# Patient Record
Sex: Female | Born: 1985 | Race: White | Hispanic: No | Marital: Married | Smoking: Never smoker
Health system: Southern US, Community
[De-identification: ages and names within clinical notes are randomized; demographics above are authoritative.]

## PROBLEM LIST (undated history)

## (undated) ENCOUNTER — Inpatient Hospital Stay (HOSPITAL_COMMUNITY): Payer: Self-pay

## (undated) DIAGNOSIS — R42 Dizziness and giddiness: Secondary | ICD-10-CM

## (undated) DIAGNOSIS — R519 Headache, unspecified: Secondary | ICD-10-CM

## (undated) DIAGNOSIS — I341 Nonrheumatic mitral (valve) prolapse: Secondary | ICD-10-CM

## (undated) HISTORY — PX: NOSE SURGERY: SHX723

## (undated) HISTORY — PX: WISDOM TOOTH EXTRACTION: SHX21

---

## 2018-01-19 LAB — OB RESULTS CONSOLE GC/CHLAMYDIA
Chlamydia: NEGATIVE
Gonorrhea: NEGATIVE

## 2018-01-19 LAB — OB RESULTS CONSOLE ABO/RH: RH Type: POSITIVE

## 2018-01-19 LAB — OB RESULTS CONSOLE RPR: RPR: NONREACTIVE

## 2018-01-19 LAB — OB RESULTS CONSOLE RUBELLA ANTIBODY, IGM: Rubella: IMMUNE

## 2018-01-19 LAB — OB RESULTS CONSOLE ANTIBODY SCREEN: Antibody Screen: NEGATIVE

## 2018-01-19 LAB — OB RESULTS CONSOLE HIV ANTIBODY (ROUTINE TESTING): HIV: NONREACTIVE

## 2018-01-19 LAB — OB RESULTS CONSOLE HEPATITIS B SURFACE ANTIGEN: Hepatitis B Surface Ag: NEGATIVE

## 2018-02-17 NOTE — L&D Delivery Note (Signed)
Delivery Note At 11:03 PM a viable female was delivered via Vaginal, Spontaneous (Presentation: direct OA).  APGAR: 9, 9; weight  pending.   Placenta status: delivered spontaneously and completely.  Cord: 3 vessel with the following complications: nuchal x1, easily reducible.   Anesthesia:  Epidural, regional lidocaine Episiotomy: None Lacerations: 1st degree;Perineal Suture Repair: 3.0 vicryl rapide Est. Blood Loss (mL):  156ml, QBL pending   Mom to postpartum.  Baby to Couplet care / Skin to Skin.  Marikay Alar 09/03/2018, 11:35 PM

## 2018-03-03 ENCOUNTER — Encounter (HOSPITAL_COMMUNITY): Payer: Self-pay | Admitting: *Deleted

## 2018-03-03 ENCOUNTER — Inpatient Hospital Stay (HOSPITAL_COMMUNITY)
Admission: AD | Admit: 2018-03-03 | Discharge: 2018-03-03 | Disposition: A | Discharge status: Home / Self Care | Payer: BC Managed Care – PPO | Attending: Obstetrics & Gynecology | Admitting: Obstetrics & Gynecology

## 2018-03-03 DIAGNOSIS — R1010 Upper abdominal pain, unspecified: Secondary | ICD-10-CM | POA: Diagnosis present

## 2018-03-03 DIAGNOSIS — Z3A14 14 weeks gestation of pregnancy: Secondary | ICD-10-CM | POA: Diagnosis not present

## 2018-03-03 DIAGNOSIS — K219 Gastro-esophageal reflux disease without esophagitis: Secondary | ICD-10-CM | POA: Diagnosis not present

## 2018-03-03 DIAGNOSIS — O99611 Diseases of the digestive system complicating pregnancy, first trimester: Secondary | ICD-10-CM | POA: Insufficient documentation

## 2018-03-03 LAB — URINALYSIS, ROUTINE W REFLEX MICROSCOPIC
Bilirubin Urine: NEGATIVE
Glucose, UA: NEGATIVE mg/dL
Hgb urine dipstick: NEGATIVE
KETONES UR: NEGATIVE mg/dL
Leukocytes, UA: NEGATIVE
Nitrite: NEGATIVE
Protein, ur: NEGATIVE mg/dL
Specific Gravity, Urine: 1.013 (ref 1.005–1.030)
pH: 6 (ref 5.0–8.0)

## 2018-03-03 LAB — CBC
HCT: 39.3 % (ref 36.0–46.0)
Hemoglobin: 13.4 g/dL (ref 12.0–15.0)
MCH: 29.8 pg (ref 26.0–34.0)
MCHC: 34.1 g/dL (ref 30.0–36.0)
MCV: 87.3 fL (ref 80.0–100.0)
NRBC: 0 % (ref 0.0–0.2)
Platelets: 243 10*3/uL (ref 150–400)
RBC: 4.5 MIL/uL (ref 3.87–5.11)
RDW: 13.9 % (ref 11.5–15.5)
WBC: 9.7 10*3/uL (ref 4.0–10.5)

## 2018-03-03 LAB — COMPREHENSIVE METABOLIC PANEL
ALT: 23 U/L (ref 0–44)
AST: 18 U/L (ref 15–41)
Albumin: 3.6 g/dL (ref 3.5–5.0)
Alkaline Phosphatase: 51 U/L (ref 38–126)
Anion gap: 10 (ref 5–15)
BUN: 7 mg/dL (ref 6–20)
CO2: 20 mmol/L — ABNORMAL LOW (ref 22–32)
Calcium: 8.6 mg/dL — ABNORMAL LOW (ref 8.9–10.3)
Chloride: 101 mmol/L (ref 98–111)
Creatinine, Ser: 0.53 mg/dL (ref 0.44–1.00)
GFR calc Af Amer: >60 mL/min (ref >60)
GFR calc non Af Amer: >60 mL/min (ref >60)
Glucose, Bld: 107 mg/dL — ABNORMAL HIGH (ref 70–99)
POTASSIUM: 3.4 mmol/L — AB (ref 3.5–5.1)
Sodium: 131 mmol/L — ABNORMAL LOW (ref 135–145)
Total Bilirubin: 0.5 mg/dL (ref 0.3–1.2)
Total Protein: 7.5 g/dL (ref 6.5–8.1)

## 2018-03-03 LAB — LIPASE, BLOOD: LIPASE: 25 U/L (ref 11–51)

## 2018-03-03 LAB — AMYLASE: Amylase: 73 U/L (ref 28–100)

## 2018-03-03 MED ORDER — DICYCLOMINE HCL 10 MG PO CAPS
20.0000 mg | ORAL_CAPSULE | Freq: Once | ORAL | Status: AC
  Administered 2018-03-03: 20 mg via ORAL
  Filled 2018-03-03: qty 2

## 2018-03-03 MED ORDER — DICYCLOMINE HCL 20 MG PO TABS
20.0000 mg | ORAL_TABLET | Freq: Once | ORAL | Status: DC
  Filled 2018-03-03: qty 1

## 2018-03-03 MED ORDER — LIDOCAINE VISCOUS HCL 2 % MT SOLN
15.0000 mL | Freq: Once | OROMUCOSAL | Status: AC
  Administered 2018-03-03: 15 mL via ORAL
  Filled 2018-03-03: qty 15

## 2018-03-03 MED ORDER — FAMOTIDINE 20 MG PO TABS: 20.0000 mg | ORAL_TABLET | Freq: Every day | ORAL | 0 refills | Status: DC

## 2018-03-03 MED ORDER — FAMOTIDINE 20 MG PO TABS
20.0000 mg | ORAL_TABLET | Freq: Once | ORAL | Status: AC
  Administered 2018-03-03: 20 mg via ORAL
  Filled 2018-03-03: qty 1

## 2018-03-03 MED ORDER — ALUM & MAG HYDROXIDE-SIMETH 200-200-20 MG/5ML PO SUSP
30.0000 mL | Freq: Once | ORAL | Status: AC
  Administered 2018-03-03: 30 mL via ORAL
  Filled 2018-03-03: qty 30

## 2018-03-03 NOTE — MAU Note (Signed)
Urine sent to lab 

## 2018-03-03 NOTE — Discharge Instructions (Signed)
Gastroesophageal Reflux Disease, Adult  Gastroesophageal reflux (GER) happens when acid from the stomach flows up into the tube that connects the mouth and the stomach (esophagus). Normally, food travels down the esophagus and stays in the stomach to be digested. With GER, food and stomach acid sometimes move back up into the esophagus. You may have a disease called gastroesophageal reflux disease (GERD) if the reflux:  · Happens often.  · Causes frequent or very bad symptoms.  · Causes problems such as damage to the esophagus.  When this happens, the esophagus becomes sore and swollen (inflamed). Over time, GERD can make small holes (ulcers) in the lining of the esophagus.  What are the causes?  This condition is caused by a problem with the muscle between the esophagus and the stomach. When this muscle is weak or not normal, it does not close properly to keep food and acid from coming back up from the stomach. The muscle can be weak because of:  · Tobacco use.  · Pregnancy.  · Having a certain type of hernia (hiatal hernia).  · Alcohol use.  · Certain foods and drinks, such as coffee, chocolate, onions, and peppermint.  What increases the risk?  You are more likely to develop this condition if you:  · Are overweight.  · Have a disease that affects your connective tissue.  · Use NSAID medicines.  What are the signs or symptoms?  Symptoms of this condition include:  · Heartburn.  · Difficult or painful swallowing.  · The feeling of having a lump in the throat.  · A bitter taste in the mouth.  · Bad breath.  · Having a lot of saliva.  · Having an upset or bloated stomach.  · Belching.  · Chest pain. Different conditions can cause chest pain. Make sure you see your doctor if you have chest pain.  · Shortness of breath or noisy breathing (wheezing).  · Ongoing (chronic) cough or a cough at night.  · Wearing away of the surface of teeth (tooth enamel).  · Weight loss.  How is this treated?  Treatment will depend on how  bad your symptoms are. Your doctor may suggest:  · Changes to your diet.  · Medicine.  · Surgery.  Follow these instructions at home:  Eating and drinking    · Follow a diet as told by your doctor. You may need to avoid foods and drinks such as:  ? Coffee and tea (with or without caffeine).  ? Drinks that contain alcohol.  ? Energy drinks and sports drinks.  ? Bubbly (carbonated) drinks or sodas.  ? Chocolate and cocoa.  ? Peppermint and mint flavorings.  ? Garlic and onions.  ? Horseradish.  ? Spicy and acidic foods. These include peppers, chili powder, curry powder, vinegar, hot sauces, and BBQ sauce.  ? Citrus fruit juices and citrus fruits, such as oranges, lemons, and limes.  ? Tomato-based foods. These include red sauce, chili, salsa, and pizza with red sauce.  ? Fried and fatty foods. These include donuts, french fries, potato chips, and high-fat dressings.  ? High-fat meats. These include hot dogs, rib eye steak, sausage, ham, and bacon.  ? High-fat dairy items, such as whole milk, butter, and cream cheese.  · Eat small meals often. Avoid eating large meals.  · Avoid drinking large amounts of liquid with your meals.  · Avoid eating meals during the 2-3 hours before bedtime.  · Avoid lying down right after you eat.  ·   Do not exercise right after you eat.  Lifestyle    · Do not use any products that contain nicotine or tobacco. These include cigarettes, e-cigarettes, and chewing tobacco. If you need help quitting, ask your doctor.  · Try to lower your stress. If you need help doing this, ask your doctor.  · If you are overweight, lose an amount of weight that is healthy for you. Ask your doctor about a safe weight loss goal.  General instructions  · Pay attention to any changes in your symptoms.  · Take over-the-counter and prescription medicines only as told by your doctor. Do not take aspirin, ibuprofen, or other NSAIDs unless your doctor says it is okay.  · Wear loose clothes. Do not wear anything tight  around your waist.  · Raise (elevate) the head of your bed about 6 inches (15 cm).  · Avoid bending over if this makes your symptoms worse.  · Keep all follow-up visits as told by your doctor. This is important.  Contact a doctor if:  · You have new symptoms.  · You lose weight and you do not know why.  · You have trouble swallowing or it hurts to swallow.  · You have wheezing or a cough that keeps happening.  · Your symptoms do not get better with treatment.  · You have a hoarse voice.  Get help right away if:  · You have pain in your arms, neck, jaw, teeth, or back.  · You feel sweaty, dizzy, or light-headed.  · You have chest pain or shortness of breath.  · You throw up (vomit) and your throw-up looks like blood or coffee grounds.  · You pass out (faint).  · Your poop (stool) is bloody or black.  · You cannot swallow, drink, or eat.  Summary  · If a person has gastroesophageal reflux disease (GERD), food and stomach acid move back up into the esophagus and cause symptoms or problems such as damage to the esophagus.  · Treatment will depend on how bad your symptoms are.  · Follow a diet as told by your doctor.  · Take all medicines only as told by your doctor.  This information is not intended to replace advice given to you by your health care provider. Make sure you discuss any questions you have with your health care provider.  Document Released: 07/23/2007 Document Revised: 08/12/2017 Document Reviewed: 08/12/2017  Elsevier Interactive Patient Education © 2019 [employer]  Food Choices for Gastroesophageal Reflux Disease, Adult  When you have gastroesophageal reflux disease (GERD), the foods you eat and your eating habits are very important. Choosing the right foods can help ease your discomfort. Think about working with a nutrition specialist (dietitian) to help you make good choices.  What are tips for following this plan?    Meals  · Choose healthy foods that are low in fat, such as fruits, vegetables,  whole grains, low-fat dairy products, and lean meat, fish, and poultry.  · Eat small meals often instead of 3 large meals a day. Eat your meals slowly, and in a place where you are relaxed. Avoid bending over or lying down until 2-3 hours after eating.  · Avoid eating meals 2-3 hours before bed.  · Avoid drinking a lot of liquid with meals.  · Cook foods using methods other than frying. Bake, grill, or broil food instead.  · Avoid or limit:  ? Chocolate.  ? Peppermint or spearmint.  ? Alcohol.  ? Pepper.  ?   Black and decaffeinated coffee.  ? Black and decaffeinated tea.  ? Bubbly (carbonated) soft drinks.  ? Caffeinated energy drinks and soft drinks.  · Limit high-fat foods such as:  ? Fatty meat or fried foods.  ? Whole milk, cream, butter, or ice cream.  ? Nuts and nut butters.  ? Pastries, donuts, and sweets made with butter or shortening.  · Avoid foods that cause symptoms. These foods may be different for everyone. Common foods that cause symptoms include:  ? Tomatoes.  ? Oranges, lemons, and limes.  ? Peppers.  ? Spicy food.  ? Onions and garlic.  ? Vinegar.  Lifestyle  · Maintain a healthy weight. Ask your doctor what weight is healthy for you. If you need to lose weight, work with your doctor to do so safely.  · Exercise for at least 30 minutes for 5 or more days each week, or as told by your doctor.  · Wear loose-fitting clothes.  · Do not smoke. If you need help quitting, ask your doctor.  · Sleep with the head of your bed higher than your feet. Use a wedge under the mattress or blocks under the bed frame to raise the head of the bed.  Summary  · When you have gastroesophageal reflux disease (GERD), food and lifestyle choices are very important in easing your symptoms.  · Eat small meals often instead of 3 large meals a day. Eat your meals slowly, and in a place where you are relaxed.  · Limit high-fat foods such as fatty meat or fried foods.  · Avoid bending over or lying down until 2-3 hours after  eating.  · Avoid peppermint and spearmint, caffeine, alcohol, and chocolate.  This information is not intended to replace advice given to you by your health care provider. Make sure you discuss any questions you have with your health care provider.  Document Released: 08/05/2011 Document Revised: 03/11/2016 Document Reviewed: 03/11/2016  Elsevier Interactive Patient Education © 2019 Elsevier Inc[employer]

## 2018-03-03 NOTE — MAU Provider Note (Signed)
Chief Complaint:  Abdominal Pain   First Provider Initiated Contact with Patient 03/03/18 0519     HPI: Brenda Gibbs is a 33 y.o. G1P0 at 314w5dwho presents to maternity admissions reporting Upper abdominal pain, mostly in Left upper quadrant .It started at 4am and was so strong it made it hard to breathe.  Radiates to her back. Has some nausea with it, new also.  Has never really had it before. She denies LOF, vaginal bleeding, vaginal itching/burning, urinary symptoms, h/a, dizziness, diarrhea, constipation or fever/chills.   [facility] but did not call them because the pain was in the upper abdomen and not in the lower abdomen.  .   Abdominal Pain  This is a new problem. The current episode started today. The onset quality is sudden. The problem occurs constantly. The problem has been unchanged. The pain is located in the LUQ and epigastric region. The quality of the pain is sharp. The abdominal pain radiates to the back. Associated symptoms include nausea. Pertinent negatives include no constipation, diarrhea, dysuria, fever, headaches, myalgias or vomiting. The pain is aggravated by deep breathing and palpation. The pain is relieved by nothing. She has tried nothing for the symptoms.   RN Note: PT SAYS WOKE  0400-  FELT SHARP PAIN FROM HER UPPER ABD - RADIATES  TO HER BACK- MAKES IT HARD TO BREATHE.  AND NAUSEA.    Past Medical History: History reviewed. No pertinent past medical history.  Past obstetric history: OB History  Gravida Para Term Preterm AB Living  1            SAB TAB Ectopic Multiple Live Births               # Outcome Date GA Lbr Len/2nd Weight Sex Delivery Anes PTL Lv  1 Current             Past Surgical History: History reviewed. No pertinent surgical history.  Family History: History reviewed. No pertinent family history.  Social History: Social History   Tobacco Use  . Smoking status: Never Smoker  . Smokeless tobacco: Never  Used  Substance Use Topics  . Alcohol use: Not on file  . Drug use: Not on file    Allergies: No Known Allergies  Meds:  Medications Prior to Admission  Medication Sig Dispense Refill Last Dose  . Prenatal Vit-Fe Fumarate-FA (PRENATAL MULTIVITAMIN) TABS tablet Take 1 tablet by mouth daily at 12 noon.   03/02/2018 at Unknown time    I have reviewed patient's Past Medical Hx, Surgical Hx, Family Hx, Social Hx, medications and allergies.   ROS:  Review of Systems  Constitutional: Negative for fever.  Gastrointestinal: Positive for abdominal pain and nausea. Negative for constipation, diarrhea and vomiting.  Genitourinary: Negative for dysuria.  Musculoskeletal: Negative for myalgias.  Neurological: Negative for headaches.   Other systems negative  Physical Exam   Patient Vitals for the past 24 hrs:  BP Temp Temp src Pulse SpO2 Height Weight  03/03/18 0505 123/70 98.4 F (36.9 C) Oral 92 100 % 5\' 5"  (1.651 m) 82.8 kg   Constitutional: Well-developed, well-nourished female in no acute distress.  Cardiovascular: normal rate and rhythm Respiratory: normal effort, clear to auscultation bilaterally GI: Abd soft, non-tender except in her LUQ., abdomen is gravid and appropriate for gestational age.   No rebound or guarding. MS: Extremities nontender, no edema, normal ROM Neurologic: Alert and oriented x 4.  GU: Neg CVAT.  PELVIC EXAM:  deferred  FHT: 152    Labs: Results for orders placed or performed during the hospital encounter of 03/03/18 (from the past 24 hour(s))  CBC     Status: None   Collection Time: 03/03/18  5:33 AM  Result Value Ref Range   WBC 9.7 4.0 - 10.5 K/uL   RBC 4.50 3.87 - 5.11 MIL/uL   Hemoglobin 13.4 12.0 - 15.0 g/dL   HCT 10.1 75.1 - 02.5 %   MCV 87.3 80.0 - 100.0 fL   MCH 29.8 26.0 - 34.0 pg   MCHC 34.1 30.0 - 36.0 g/dL   RDW 85.2 77.8 - 24.2 %   Platelets 243 150 - 400 K/uL   nRBC 0.0 0.0 - 0.2 %  Comprehensive metabolic panel     Status:  Abnormal   Collection Time: 03/03/18  5:33 AM  Result Value Ref Range   Sodium 131 (L) 135 - 145 mmol/L   Potassium 3.4 (L) 3.5 - 5.1 mmol/L   Chloride 101 98 - 111 mmol/L   CO2 20 (L) 22 - 32 mmol/L   Glucose, Bld 107 (H) 70 - 99 mg/dL   BUN 7 6 - 20 mg/dL   Creatinine, Ser 3.53 0.44 - 1.00 mg/dL   Calcium 8.6 (L) 8.9 - 10.3 mg/dL   Total Protein 7.5 6.5 - 8.1 g/dL   Albumin 3.6 3.5 - 5.0 g/dL   AST 18 15 - 41 U/L   ALT 23 0 - 44 U/L   Alkaline Phosphatase 51 38 - 126 U/L   Total Bilirubin 0.5 0.3 - 1.2 mg/dL   GFR calc non Af Amer >60 >60 mL/min   GFR calc Af Amer >60 >60 mL/min   Anion gap 10 5 - 15  Lipase, blood     Status: None   Collection Time: 03/03/18  5:33 AM  Result Value Ref Range   Lipase 25 11 - 51 U/L  Amylase     Status: None   Collection Time: 03/03/18  5:33 AM  Result Value Ref Range   Amylase 73 28 - 100 U/L    Imaging:  No results found.  MAU Course/MDM: I have ordered labs and reviewed results. These were normal   We gave her a GI cocktail with Maalox and Lidocaine, but she did not really get the lidocaine down, as she gagged and expectorated much of it.  States she felt a little better afterward but not much.   Bentyl and Pepcid given with good relief    Assessment: Pregnancy at [redacted]w[redacted]d [redacted] weeks gestation of pregnancy - Plan: Discharge patient  Gastroesophageal reflux disease without esophagitis - Plan: Discharge patient    Plan: Discharge home Rx Pepcid given  Follow up in Office for prenatal visits and recheck of status  Encouraged to return here or to other Urgent Care/ED if she develops worsening of symptoms, increase in pain, fever, or other concerning symptoms.   Pt stable at time of discharge.  Wynelle Bourgeois CNM, MSN Certified Nurse-Midwife 03/03/2018 5:19 AM

## 2018-03-16 ENCOUNTER — Other Ambulatory Visit: Payer: Self-pay | Admitting: Obstetrics and Gynecology

## 2018-03-16 DIAGNOSIS — R109 Unspecified abdominal pain: Secondary | ICD-10-CM

## 2018-03-17 ENCOUNTER — Other Ambulatory Visit: Payer: Self-pay | Admitting: Obstetrics and Gynecology

## 2018-03-19 ENCOUNTER — Ambulatory Visit
Admission: RE | Admit: 2018-03-19 | Discharge: 2018-03-19 | Disposition: A | Discharge status: Home / Self Care | Payer: BC Managed Care – PPO | Source: Ambulatory Visit | Attending: Obstetrics and Gynecology | Admitting: Obstetrics and Gynecology

## 2018-03-19 DIAGNOSIS — R109 Unspecified abdominal pain: Secondary | ICD-10-CM

## 2018-03-28 ENCOUNTER — Other Ambulatory Visit (HOSPITAL_COMMUNITY): Payer: Self-pay | Admitting: Certified Nurse Midwife

## 2018-07-27 LAB — OB RESULTS CONSOLE GBS: GBS: NEGATIVE

## 2018-08-02 ENCOUNTER — Encounter (HOSPITAL_COMMUNITY): Payer: Self-pay | Admitting: *Deleted

## 2018-08-02 ENCOUNTER — Other Ambulatory Visit: Payer: Self-pay

## 2018-08-02 ENCOUNTER — Inpatient Hospital Stay (HOSPITAL_COMMUNITY)
Admission: AD | Admit: 2018-08-02 | Discharge: 2018-08-02 | Disposition: A | Discharge status: Home / Self Care | Payer: BC Managed Care – PPO | Source: Ambulatory Visit | Attending: Obstetrics & Gynecology | Admitting: Obstetrics & Gynecology

## 2018-08-02 DIAGNOSIS — O4703 False labor before 37 completed weeks of gestation, third trimester: Secondary | ICD-10-CM | POA: Insufficient documentation

## 2018-08-02 DIAGNOSIS — Z3A36 36 weeks gestation of pregnancy: Secondary | ICD-10-CM | POA: Diagnosis not present

## 2018-08-02 DIAGNOSIS — O479 False labor, unspecified: Secondary | ICD-10-CM | POA: Diagnosis not present

## 2018-08-02 LAB — URINALYSIS, ROUTINE W REFLEX MICROSCOPIC
Bilirubin Urine: NEGATIVE
Glucose, UA: NEGATIVE mg/dL
Ketones, ur: 5 mg/dL — AB
Leukocytes,Ua: NEGATIVE
Nitrite: NEGATIVE
Protein, ur: NEGATIVE mg/dL
Specific Gravity, Urine: 1.004 — ABNORMAL LOW (ref 1.005–1.030)
pH: 6 (ref 5.0–8.0)

## 2018-08-02 LAB — GC/CHLAMYDIA PROBE AMP (~~LOC~~) NOT AT ARMC
Chlamydia: NEGATIVE
Neisseria Gonorrhea: NEGATIVE

## 2018-08-02 LAB — WET PREP, GENITAL
Clue Cells Wet Prep HPF POC: NONE SEEN
Sperm: NONE SEEN
Trich, Wet Prep: NONE SEEN
Yeast Wet Prep HPF POC: NONE SEEN

## 2018-08-02 NOTE — MAU Note (Signed)
Pt c/o red/brown spotting 4-5 hours ago,  Three hours ago started with mild,  lower  abd cramping. Feels the baby moving well.

## 2018-08-02 NOTE — MAU Provider Note (Signed)
None      S: Ms. Brenda Gibbs is a 33 y.o. G1P0 at [redacted]w[redacted]d  who presents to MAU today complaining contractions q 4 minutes since 10 pm. She denies vaginal bleeding but endorses some reddish-brown spotting. . She denies LOF. She reports normal fetal movement.    O: BP 130/84 (BP Location: Left Arm)   Pulse (!) 121   Temp 98.6 F (37 C) (Oral)   Resp 18   Ht 5\' 5"  (1.651 m)   Wt 86.6 kg   LMP 11/20/2017   BMI 31.78 kg/m  GENERAL: Well-developed, well-nourished female in no acute distress.  HEAD: Normocephalic, atraumatic.  CHEST: Normal effort of breathing, regular heart rate ABDOMEN: Soft, nontender, gravid  Cervical exam:  Dilation: Fingertip Effacement (%): Thick Exam by:: Denyse Dago, RN Patient's exam is unchanged while in MAU; no blood on RN's glove during exam and no bleeding while in MAU.   Fetal Monitoring: Baseline: 140 Variability: mod Accelerations: present Decelerations: negative Contractions: uterine irritability  Will draw wet prep and GC/CT to check for infection, which could possibly be causing discharge.   A: SIUP at [redacted]w[redacted]d  False labor Wet prep negative P: Keep next OB appt; return to MAU if her condition were to worsen or change.  -reviewed warning signs and when to return to MAU  [place], [place] 08/02/2018 6:00 AM

## 2018-08-25 ENCOUNTER — Telehealth (HOSPITAL_COMMUNITY): Payer: Self-pay | Admitting: *Deleted

## 2018-08-25 ENCOUNTER — Encounter (HOSPITAL_COMMUNITY): Payer: Self-pay

## 2018-08-25 NOTE — Telephone Encounter (Signed)
Preadmission screen  

## 2018-08-26 ENCOUNTER — Telehealth (HOSPITAL_COMMUNITY): Payer: Self-pay | Admitting: *Deleted

## 2018-08-26 NOTE — Telephone Encounter (Signed)
Preadmission screen  

## 2018-08-27 ENCOUNTER — Telehealth (HOSPITAL_COMMUNITY): Payer: Self-pay | Admitting: *Deleted

## 2018-08-27 ENCOUNTER — Encounter (HOSPITAL_COMMUNITY): Payer: Self-pay | Admitting: *Deleted

## 2018-08-27 NOTE — Telephone Encounter (Signed)
Preadmission screen  

## 2018-08-31 ENCOUNTER — Other Ambulatory Visit: Payer: Self-pay | Admitting: Obstetrics and Gynecology

## 2018-09-01 ENCOUNTER — Other Ambulatory Visit: Payer: Self-pay

## 2018-09-01 ENCOUNTER — Ambulatory Visit (HOSPITAL_COMMUNITY)
Admission: RE | Admit: 2018-09-01 | Discharge: 2018-09-01 | Disposition: A | Discharge status: Home / Self Care | Payer: BC Managed Care – PPO | Source: Ambulatory Visit | Attending: Obstetrics and Gynecology | Admitting: Obstetrics and Gynecology

## 2018-09-01 DIAGNOSIS — Z1159 Encounter for screening for other viral diseases: Secondary | ICD-10-CM | POA: Diagnosis not present

## 2018-09-01 DIAGNOSIS — Z01812 Encounter for preprocedural laboratory examination: Secondary | ICD-10-CM | POA: Insufficient documentation

## 2018-09-01 HISTORY — DX: Nonrheumatic mitral (valve) prolapse: I34.1

## 2018-09-01 HISTORY — DX: Headache, unspecified: R51.9

## 2018-09-01 HISTORY — DX: Dizziness and giddiness: R42

## 2018-09-01 LAB — SARS CORONAVIRUS 2 (TAT 6-24 HRS): SARS Coronavirus 2: NEGATIVE

## 2018-09-01 NOTE — MAU Note (Signed)
Covid swab collected. Pt tolerated well. PT asymptomatic 

## 2018-09-03 ENCOUNTER — Other Ambulatory Visit: Payer: Self-pay

## 2018-09-03 ENCOUNTER — Inpatient Hospital Stay (HOSPITAL_COMMUNITY)
Admission: AD | Admit: 2018-09-03 | Discharge: 2018-09-05 | DRG: 807 | Disposition: A | Discharge status: Home / Self Care | Payer: BC Managed Care – PPO | Attending: Obstetrics & Gynecology | Admitting: Obstetrics & Gynecology

## 2018-09-03 ENCOUNTER — Inpatient Hospital Stay (HOSPITAL_COMMUNITY): Payer: BC Managed Care – PPO | Admitting: Anesthesiology

## 2018-09-03 ENCOUNTER — Inpatient Hospital Stay (HOSPITAL_COMMUNITY)
Admission: AD | Admit: 2018-09-03 | Disposition: A | Discharge status: Home / Self Care | Payer: BC Managed Care – PPO | Source: Home / Self Care | Admitting: Obstetrics & Gynecology

## 2018-09-03 ENCOUNTER — Encounter (HOSPITAL_COMMUNITY): Payer: Self-pay | Admitting: *Deleted

## 2018-09-03 DIAGNOSIS — Z1159 Encounter for screening for other viral diseases: Secondary | ICD-10-CM

## 2018-09-03 DIAGNOSIS — O480 Post-term pregnancy: Secondary | ICD-10-CM | POA: Diagnosis present

## 2018-09-03 DIAGNOSIS — O134 Gestational [pregnancy-induced] hypertension without significant proteinuria, complicating childbirth: Secondary | ICD-10-CM | POA: Diagnosis present

## 2018-09-03 DIAGNOSIS — O139 Gestational [pregnancy-induced] hypertension without significant proteinuria, unspecified trimester: Secondary | ICD-10-CM | POA: Clinically undetermined

## 2018-09-03 DIAGNOSIS — Z3A41 41 weeks gestation of pregnancy: Secondary | ICD-10-CM | POA: Diagnosis not present

## 2018-09-03 DIAGNOSIS — O133 Gestational [pregnancy-induced] hypertension without significant proteinuria, third trimester: Secondary | ICD-10-CM

## 2018-09-03 LAB — COMPREHENSIVE METABOLIC PANEL
ALT: 12 U/L (ref 0–44)
AST: 18 U/L (ref 15–41)
Albumin: 3 g/dL — ABNORMAL LOW (ref 3.5–5.0)
Alkaline Phosphatase: 130 U/L — ABNORMAL HIGH (ref 38–126)
Anion gap: 12 (ref 5–15)
BUN: 7 mg/dL (ref 6–20)
CO2: 19 mmol/L — ABNORMAL LOW (ref 22–32)
Calcium: 9.1 mg/dL (ref 8.9–10.3)
Chloride: 105 mmol/L (ref 98–111)
Creatinine, Ser: 0.69 mg/dL (ref 0.44–1.00)
GFR calc Af Amer: >60 mL/min (ref >60)
GFR calc non Af Amer: >60 mL/min (ref >60)
Glucose, Bld: 97 mg/dL (ref 70–99)
Potassium: 3.9 mmol/L (ref 3.5–5.1)
Sodium: 136 mmol/L (ref 135–145)
Total Bilirubin: 0.4 mg/dL (ref 0.3–1.2)
Total Protein: 6.5 g/dL (ref 6.5–8.1)

## 2018-09-03 LAB — PROTEIN / CREATININE RATIO, URINE
Creatinine, Urine: 315.59 mg/dL
Protein Creatinine Ratio: 0.18 mg/mg{Cre} — ABNORMAL HIGH (ref 0.00–0.15)
Total Protein, Urine: 58 mg/dL

## 2018-09-03 LAB — CBC
HCT: 37.6 % (ref 36.0–46.0)
Hemoglobin: 12.6 g/dL (ref 12.0–15.0)
MCH: 29.6 pg (ref 26.0–34.0)
MCHC: 33.5 g/dL (ref 30.0–36.0)
MCV: 88.5 fL (ref 80.0–100.0)
Platelets: 225 10*3/uL (ref 150–400)
RBC: 4.25 MIL/uL (ref 3.87–5.11)
RDW: 13.1 % (ref 11.5–15.5)
WBC: 14.6 10*3/uL — ABNORMAL HIGH (ref 4.0–10.5)
nRBC: 0 % (ref 0.0–0.2)

## 2018-09-03 LAB — TYPE AND SCREEN
ABO/RH(D): O POS
Antibody Screen: NEGATIVE

## 2018-09-03 LAB — ABO/RH: ABO/RH(D): O POS

## 2018-09-03 MED ORDER — LACTATED RINGERS IV SOLN
INTRAVENOUS | Status: DC
  Administered 2018-09-03 (×3): via INTRAVENOUS

## 2018-09-03 MED ORDER — SODIUM CHLORIDE (PF) 0.9 % IJ SOLN
INTRAMUSCULAR | Status: DC | PRN
  Administered 2018-09-03: 12 mL/h via EPIDURAL

## 2018-09-03 MED ORDER — LABETALOL HCL 5 MG/ML IV SOLN: 40.0000 mg | INTRAVENOUS | Status: DC | PRN

## 2018-09-03 MED ORDER — FENTANYL-BUPIVACAINE-NACL 0.5-0.125-0.9 MG/250ML-% EP SOLN
12.0000 mL/h | EPIDURAL | Status: DC | PRN
  Filled 2018-09-03: qty 250

## 2018-09-03 MED ORDER — LACTATED RINGERS IV SOLN: 500.0000 mL | Freq: Once | INTRAVENOUS | Status: DC

## 2018-09-03 MED ORDER — LIDOCAINE HCL (PF) 1 % IJ SOLN
INTRAMUSCULAR | Status: DC | PRN
  Administered 2018-09-03 (×2): 5 mL via EPIDURAL

## 2018-09-03 MED ORDER — HYDRALAZINE HCL 20 MG/ML IJ SOLN: 10.0000 mg | INTRAMUSCULAR | Status: DC | PRN

## 2018-09-03 MED ORDER — TERBUTALINE SULFATE 1 MG/ML IJ SOLN: 0.2500 mg | Freq: Once | INTRAMUSCULAR | Status: DC | PRN

## 2018-09-03 MED ORDER — DIPHENHYDRAMINE HCL 50 MG/ML IJ SOLN: 12.5000 mg | INTRAMUSCULAR | Status: DC | PRN

## 2018-09-03 MED ORDER — OXYTOCIN BOLUS FROM INFUSION
500.0000 mL | Freq: Once | INTRAVENOUS | Status: AC
  Administered 2018-09-03: 500 mL via INTRAVENOUS

## 2018-09-03 MED ORDER — FENTANYL CITRATE (PF) 100 MCG/2ML IJ SOLN: 50.0000 ug | INTRAMUSCULAR | Status: DC | PRN

## 2018-09-03 MED ORDER — SOD CITRATE-CITRIC ACID 500-334 MG/5ML PO SOLN: 30.0000 mL | ORAL | Status: DC | PRN

## 2018-09-03 MED ORDER — PHENYLEPHRINE 40 MCG/ML (10ML) SYRINGE FOR IV PUSH (FOR BLOOD PRESSURE SUPPORT): 80.0000 ug | PREFILLED_SYRINGE | INTRAVENOUS | Status: DC | PRN

## 2018-09-03 MED ORDER — LACTATED RINGERS IV SOLN: 500.0000 mL | INTRAVENOUS | Status: DC | PRN

## 2018-09-03 MED ORDER — LIDOCAINE HCL (PF) 1 % IJ SOLN
30.0000 mL | INTRAMUSCULAR | Status: AC | PRN
  Administered 2018-09-03: 30 mL via SUBCUTANEOUS
  Filled 2018-09-03: qty 30

## 2018-09-03 MED ORDER — OXYTOCIN 40 UNITS IN NORMAL SALINE INFUSION - SIMPLE MED: 2.5000 [IU]/h | INTRAVENOUS | Status: DC

## 2018-09-03 MED ORDER — EPHEDRINE 5 MG/ML INJ: 10.0000 mg | INTRAVENOUS | Status: DC | PRN

## 2018-09-03 MED ORDER — PHENYLEPHRINE 40 MCG/ML (10ML) SYRINGE FOR IV PUSH (FOR BLOOD PRESSURE SUPPORT)
80.0000 ug | PREFILLED_SYRINGE | INTRAVENOUS | Status: DC | PRN
  Filled 2018-09-03: qty 10

## 2018-09-03 MED ORDER — OXYTOCIN 40 UNITS IN NORMAL SALINE INFUSION - SIMPLE MED: 1.0000 m[IU]/min | INTRAVENOUS | Status: DC

## 2018-09-03 MED ORDER — LABETALOL HCL 5 MG/ML IV SOLN: 20.0000 mg | INTRAVENOUS | Status: DC | PRN

## 2018-09-03 MED ORDER — ACETAMINOPHEN 325 MG PO TABS: 650.0000 mg | ORAL_TABLET | ORAL | Status: DC | PRN

## 2018-09-03 MED ORDER — ONDANSETRON HCL 4 MG/2ML IJ SOLN
4.0000 mg | Freq: Four times a day (QID) | INTRAMUSCULAR | Status: DC | PRN
  Administered 2018-09-03: 4 mg via INTRAVENOUS
  Filled 2018-09-03: qty 2

## 2018-09-03 MED ORDER — FLEET ENEMA 7-19 GM/118ML RE ENEM: 1.0000 | ENEMA | RECTAL | Status: DC | PRN

## 2018-09-03 MED ORDER — OXYTOCIN 40 UNITS IN NORMAL SALINE INFUSION - SIMPLE MED
1.0000 m[IU]/min | INTRAVENOUS | Status: DC
  Administered 2018-09-03: 2 m[IU]/min via INTRAVENOUS
  Filled 2018-09-03: qty 1000

## 2018-09-03 MED ORDER — LABETALOL HCL 5 MG/ML IV SOLN: 80.0000 mg | INTRAVENOUS | Status: DC | PRN

## 2018-09-03 NOTE — Progress Notes (Signed)
Orders received to increase Pitocin by 1 milliunit for a rate of 6 milliunits per Ranee Gosselin, CNM after she spoke with Dr. Mancel Bale

## 2018-09-03 NOTE — Progress Notes (Signed)
Brenda Gibbs is a 33 y.o. G1P0 at [redacted]w[redacted]d   Subjective: Feels vaginal pressure with contractions  Objective: BP 114/84   Pulse (!) 117   Temp 99 F (37.2 C) (Oral)   Resp 20   Ht 5\' 5"  (1.651 m)   Wt 88.9 kg   LMP 11/20/2017   SpO2 99%   BMI 32.62 kg/m  No intake/output data recorded. No intake/output data recorded.  FHT:  FHR: 140s bpm, variability: moderate,  accelerations:  Present,  decelerations:  Absent UC:   regular, every 3 minutes SVE:   Dilation: Lip/rim Effacement (%): 90 Station: 0 Exam by:: Franchot Erichsen, RNC  Labs: Lab Results  Component Value Date   WBC 14.6 (H) 09/03/2018   HGB 12.6 09/03/2018   HCT 37.6 09/03/2018   MCV 88.5 09/03/2018   PLT 225 09/03/2018    Assessment / Plan: Augmentation of labor, progressing well  Labor: Progressing well.  AROM at 1845.  Pitocin decreased to 19mu/min from 10 just prior to AROM with clear fluid.  AL/0-+1 Preeclampsia:  no signs or symptoms of toxicity Fetal Wellbeing:  Category I s/p episode of cat II Pain Control:  Epidural I/D:  GBS neg Anticipated MOD:  NSVD  Delice Lesch 09/03/2018, 7:42 PM

## 2018-09-03 NOTE — Progress Notes (Signed)
10 Instruments 5 9*9 5 4*18 2 Injectables  

## 2018-09-03 NOTE — H&P (Signed)
Brenda Gibbs is a 33 y.o. female presenting for regular painful contractions q 5 minutes.  During evaluation in MAU blood pressures noted to be elevated.  The patient denies headache, no blurry vision, no scotomata or RUQ pain.  She denies vaginal bleeding, no leaking of fluid, and reports good fetal movement.  She was scheduled for IOL tonight.   OB History    Gravida  1   Para      Term      Preterm      AB      Living        SAB      TAB      Ectopic      Multiple      Live Births             Past Medical History:  Diagnosis Date  . Headache   . MVP (mitral valve prolapse)   . Vertigo    Past Surgical History:  Procedure Laterality Date  . NOSE SURGERY    . WISDOM TOOTH EXTRACTION     Family History: family history includes Heart attack in her maternal grandfather and paternal grandfather. Social History:  reports that she has never smoked. She has never used smokeless tobacco. She reports previous alcohol use. She reports that she does not use drugs.     Maternal Diabetes: No Genetic Screening: Normal  Quad screen neg Maternal Ultrasounds/Referrals: Normal Fetal Ultrasounds or other Referrals:  None Maternal Substance Abuse:  No Significant Maternal Medications:  None Significant Maternal Lab Results:  Group B Strep negative Other Comments:  None  Review of Systems  Constitutional: Negative for chills and fever.  Eyes: Negative for blurred vision and double vision.  Gastrointestinal: Negative for abdominal pain, nausea and vomiting.  Neurological: Negative for dizziness and headaches.  All other systems reviewed and are negative.  Maternal Medical History:  Reason for admission: Contractions.  Nausea.  Contractions: Onset was 13-24 hours ago.   Frequency: regular.   Duration is approximately 1 minute.   Perceived severity is moderate.    Fetal activity: Perceived fetal activity is normal.   Last perceived fetal movement was within the  past 12 hours.    Prenatal complications: no prenatal complications Prenatal Complications - Diabetes: none.    Dilation: Fingertip Effacement (%): 50 Exam by:: Brenda Morris RN Blood pressure (!) 144/96, pulse (!) 129, temperature 98.2 F (36.8 C), temperature source Oral, resp. rate 20, height 5\' 5"  (1.651 m), weight 88.9 kg, last menstrual period 11/20/2017, SpO2 100 %. Maternal Exam:  Uterine Assessment: Contraction strength is mild.  Contraction frequency is regular.   Abdomen: Patient reports no abdominal tenderness. Fundal height is 40 weeks .   Estimated fetal weight is 3800 grams.   Fetal presentation: vertex  Introitus: Normal vulva. Normal vagina.  Ferning test: not done.  Nitrazine test: not done. Amniotic fluid character: not assessed.  Pelvis: adequate for delivery.      Fetal Exam Fetal Monitor Review: Baseline rate: 135.  Variability: moderate (6-25 bpm).   Pattern: accelerations present and no decelerations.    Fetal State Assessment: Category I - tracings are normal.     Physical Exam  Nursing note and vitals reviewed. Constitutional: She appears well-developed and well-nourished.  Genitourinary:    Vulva normal.    Cervix: 1/50/-2  Prenatal labs: ABO, Rh: --/--/O POS, O POS Performed at [facility], [place] [address], [place], [place] [address]  [phone]) Antibody: NEG (07/17  1055) Rubella: Immune (12/03 0000) RPR: Nonreactive (12/03 0000)  HBsAg: Negative (12/03 0000)  HIV: Non-reactive (12/03 0000)  GBS: Negative (06/09 0000)   Assessment/Plan: 33 year old G1P0 at 41 weeks in early labor with probably gestational hypertension Admit to Labor and delivery PIH labs normal Continuous monitoring Patient contracting currently on her own q 2minutes, if they space out will place either FB and do pitocin or place a cytotec Brenda Gibbs Brenda Gibbs 09/03/2018, 1:33 PM

## 2018-09-03 NOTE — Anesthesia Procedure Notes (Signed)
Epidural Patient location during procedure: OB Start time: 09/03/2018 2:27 PM End time: 09/03/2018 2:37 PM  Staffing Anesthesiologist: Albertha Ghee, MD Performed: anesthesiologist   Preanesthetic Checklist Completed: patient identified, site marked, pre-op evaluation, timeout performed, IV checked, risks and benefits discussed and monitors and equipment checked  Epidural Patient position: sitting Prep: DuraPrep Patient monitoring: heart rate, cardiac monitor, continuous pulse ox and blood pressure Approach: midline Location: L2-L3 Injection technique: LOR saline  Needle:  Needle type: Tuohy  Needle gauge: 17 G Needle length: 9 cm Needle insertion depth: 5 cm Catheter type: closed end flexible Catheter size: 19 Gauge Catheter at skin depth: 11 cm Test dose: negative and Other  Assessment Events: blood not aspirated, injection not painful, no injection resistance and negative IV test  Additional Notes Informed consent obtained prior to proceeding including risk of failure, 1% risk of PDPH, risk of minor discomfort and bruising.  Discussed rare but serious complications including epidural abscess, permanent nerve injury, epidural hematoma.  Discussed alternatives to epidural analgesia and patient desires to proceed.  Timeout performed pre-procedure verifying patient name, procedure, and platelet count.  Patient tolerated procedure well. Reason for block:procedure for pain

## 2018-09-03 NOTE — Anesthesia Preprocedure Evaluation (Signed)
Anesthesia Evaluation  Patient identified by MRN, date of birth, ID band Patient awake    Reviewed: Allergy & Precautions, H&P , NPO status , Patient's Chart, lab work & pertinent test results  Airway Mallampati: II   Neck ROM: full    Dental   Pulmonary neg pulmonary ROS,    breath sounds clear to auscultation       Cardiovascular negative cardio ROS   Rhythm:regular Rate:Normal     Neuro/Psych  Headaches,    GI/Hepatic   Endo/Other    Renal/GU      Musculoskeletal   Abdominal   Peds  Hematology   Anesthesia Other Findings   Reproductive/Obstetrics                             Anesthesia Physical Anesthesia Plan  ASA: II  Anesthesia Plan:    Post-op Pain Management:    Induction:   PONV Risk Score and Plan: 2 and Treatment may vary due to age or medical condition  Airway Management Planned: Natural Airway  Additional Equipment:   Intra-op Plan:   Post-operative Plan:   Informed Consent: I have reviewed the patients History and Physical, chart, labs and discussed the procedure including the risks, benefits and alternatives for the proposed anesthesia with the patient or authorized representative who has indicated his/her understanding and acceptance.       Plan Discussed with: Anesthesiologist  Anesthesia Plan Comments:         Anesthesia Quick Evaluation

## 2018-09-03 NOTE — MAU Note (Signed)
.   Brenda Gibbs is a 33 y.o. at [redacted]w[redacted]d here in MAU reporting:contractions that started around 415. Denies any VB or LOF. Has and induction scheduled for tonight   Onset of complaint: 0415 Pain score: 9 Vitals:   09/03/18 1011  BP: (!) 148/86  Pulse: (!) 105  Resp: 16  Temp: 97.9 F (36.6 C)  SpO2: 100%     FHT:135 Lab orders placed from triage:

## 2018-09-03 NOTE — MAU Provider Note (Signed)
Chief Complaint  Patient presents with  . Contractions     First Provider Initiated Contact with Patient 09/03/18 1030      S: Brenda Gibbs  is a 33 y.o. y.o. year old G65P0 female at [redacted]w[redacted]d weeks gestation who presents to MAU for labor evaluation with elevated blood pressures. Denies Hx of hypertension. Current blood pressure medication: none.  Patient is scheduled for PD IOL tonight. Had negQ5ative pre admit COVID testing.   Associated symptoms: denies Headache, denies vision changes, denies epigastric pain Contractions: Q5 mins Vaginal bleeding: denies  Fetal movement: good  O:  Patient Vitals for the past 24 hrs:  BP Temp Pulse Resp SpO2  09/03/18 1023 (!) 147/88 - 94 - -  09/03/18 1017 (!) 139/93 - - - -  09/03/18 1011 (!) 148/86 97.9 F (36.6 C) (!) 105 16 100 %   General: NAD Heart: Regular rate Lungs: Normal rate and effort Abd: Soft, NT, Gravid, S=D Neuro: 2+ deep tendon reflexes, No clonus Pelvic: NEFG, no bleeding or LOF.   Dilation: Fingertip Effacement (%): 50 Cervical Position: Middle Presentation: Undeterminable Exam by:: GInger Morris RN    Pt informed that the ultrasound is considered a limited OB ultrasound and is not intended to be a complete ultrasound exam.  Patient also informed that the ultrasound is not being completed with the intent of assessing for fetal or placental anomalies or any pelvic abnormalities.  Explained that the purpose of today's ultrasound is to assess for  presentation.  Patient acknowledges the purpose of the exam and the limitations of the study.  Cephalic    NST:  Baseline: 135 bpm, Variability: Good {> 6 bpm), Accelerations: Reactive and Decelerations: Absent  No results found for this or any previous visit (from the past 24 hour(s)).  A:  1. Post term pregnancy, 41 weeks   2. Gestational hypertension, third trimester      P:  Admit to birthing suites PEC labs ordered COVID negative Discussed patient with Dr.  Trish Mage, Junie Panning, NP 09/03/2018 10:43 AM

## 2018-09-04 ENCOUNTER — Inpatient Hospital Stay (HOSPITAL_COMMUNITY): Payer: BC Managed Care – PPO

## 2018-09-04 ENCOUNTER — Encounter (HOSPITAL_COMMUNITY): Payer: Self-pay

## 2018-09-04 LAB — CBC
HCT: 33.1 % — ABNORMAL LOW (ref 36.0–46.0)
Hemoglobin: 11.1 g/dL — ABNORMAL LOW (ref 12.0–15.0)
MCH: 29.6 pg (ref 26.0–34.0)
MCHC: 33.5 g/dL (ref 30.0–36.0)
MCV: 88.3 fL (ref 80.0–100.0)
Platelets: 193 10*3/uL (ref 150–400)
RBC: 3.75 MIL/uL — ABNORMAL LOW (ref 3.87–5.11)
RDW: 13.2 % (ref 11.5–15.5)
WBC: 19.8 10*3/uL — ABNORMAL HIGH (ref 4.0–10.5)
nRBC: 0 % (ref 0.0–0.2)

## 2018-09-04 LAB — RPR: RPR Ser Ql: NONREACTIVE

## 2018-09-04 MED ORDER — DIBUCAINE (PERIANAL) 1 % EX OINT: 1.0000 "application " | TOPICAL_OINTMENT | CUTANEOUS | Status: DC | PRN

## 2018-09-04 MED ORDER — SIMETHICONE 80 MG PO CHEW: 80.0000 mg | CHEWABLE_TABLET | ORAL | Status: DC | PRN

## 2018-09-04 MED ORDER — ACETAMINOPHEN 325 MG PO TABS: 650.0000 mg | ORAL_TABLET | ORAL | Status: DC | PRN

## 2018-09-04 MED ORDER — ONDANSETRON HCL 4 MG PO TABS: 4.0000 mg | ORAL_TABLET | ORAL | Status: DC | PRN

## 2018-09-04 MED ORDER — ONDANSETRON HCL 4 MG/2ML IJ SOLN: 4.0000 mg | INTRAMUSCULAR | Status: DC | PRN

## 2018-09-04 MED ORDER — TETANUS-DIPHTH-ACELL PERTUSSIS 5-2.5-18.5 LF-MCG/0.5 IM SUSP: 0.5000 mL | Freq: Once | INTRAMUSCULAR | Status: DC

## 2018-09-04 MED ORDER — SENNOSIDES-DOCUSATE SODIUM 8.6-50 MG PO TABS
2.0000 | ORAL_TABLET | ORAL | Status: DC
  Administered 2018-09-04 (×2): 2 via ORAL
  Filled 2018-09-04 (×2): qty 2

## 2018-09-04 MED ORDER — ZOLPIDEM TARTRATE 5 MG PO TABS: 5.0000 mg | ORAL_TABLET | Freq: Every evening | ORAL | Status: DC | PRN

## 2018-09-04 MED ORDER — COCONUT OIL OIL
1.0000 "application " | TOPICAL_OIL | Status: DC | PRN
  Administered 2018-09-04: 1 via TOPICAL

## 2018-09-04 MED ORDER — PRENATAL MULTIVITAMIN CH
1.0000 | ORAL_TABLET | Freq: Every day | ORAL | Status: DC
  Administered 2018-09-04 – 2018-09-05 (×2): 1 via ORAL
  Filled 2018-09-04 (×2): qty 1

## 2018-09-04 MED ORDER — DIPHENHYDRAMINE HCL 25 MG PO CAPS: 25.0000 mg | ORAL_CAPSULE | Freq: Four times a day (QID) | ORAL | Status: DC | PRN

## 2018-09-04 MED ORDER — BENZOCAINE-MENTHOL 20-0.5 % EX AERO
1.0000 "application " | INHALATION_SPRAY | CUTANEOUS | Status: DC | PRN
  Administered 2018-09-04: 1 via TOPICAL
  Filled 2018-09-04: qty 56

## 2018-09-04 MED ORDER — WITCH HAZEL-GLYCERIN EX PADS: 1.0000 "application " | MEDICATED_PAD | CUTANEOUS | Status: DC | PRN

## 2018-09-04 MED ORDER — IBUPROFEN 600 MG PO TABS
600.0000 mg | ORAL_TABLET | Freq: Four times a day (QID) | ORAL | Status: DC
  Administered 2018-09-04 – 2018-09-05 (×6): 600 mg via ORAL
  Filled 2018-09-04 (×6): qty 1

## 2018-09-04 NOTE — Anesthesia Postprocedure Evaluation (Signed)
Anesthesia Post Note  Patient: Candance Bohlman  Procedure(s) Performed: AN AD [place]     Patient location during evaluation: Mother Baby Anesthesia Type: Epidural Level of consciousness: awake and alert Pain management: pain level controlled Vital Signs Assessment: post-procedure vital signs reviewed and stable Respiratory status: spontaneous breathing, nonlabored ventilation and respiratory function stable Cardiovascular status: stable Postop Assessment: no headache, no backache and epidural receding Anesthetic complications: no    Last Vitals:  Vitals:   09/04/18 0220 09/04/18 0620  BP: (!) 150/78 (!) 108/52  Pulse: 90 87  Resp: 18 16  Temp: 37.4 C 37.1 C  SpO2: 97% 99%    Last Pain:  Vitals:   09/04/18 0645  TempSrc:   PainSc: 1    Pain Goal: Patients Stated Pain Goal: 4 (09/03/18 1118)                 Rayvon Char

## 2018-09-04 NOTE — Progress Notes (Signed)
Post Partum Day 1 Subjective: no complaints and up ad lib. Baby latching well and breastfeeding for long periods. Asymptomatic for preeclampsia.   Objective: Vitals:   09/04/18 0030 09/04/18 0100 09/04/18 0120 09/04/18 0220  BP: 138/76 136/83 (!) 143/83 (!) 150/78  Pulse: 92 87 100 90  Resp:   18 18  Temp:   98.8 F (37.1 C) 99.3 F (37.4 C)  TempSrc:   Oral Oral  SpO2:   97% 97%  Weight:      Height:        Physical Exam:  General: alert and cooperative Lochia: appropriate Uterine Fundus: firm Incision: n/a DVT Evaluation: No evidence of DVT seen on physical exam. Negative Homan's sign. No cords or calf tenderness. No significant calf/ankle edema.  Recent Labs    09/03/18 1055  HGB 12.6  HCT 37.6    Assessment/Plan: Plan for discharge tomorrow and Breastfeeding   LOS: 1 day   Marikay Alar 09/04/2018, 3:18 AM

## 2018-09-04 NOTE — Lactation Note (Signed)
This note was copied from a baby's chart. Lactation Consultation Note  Patient Name: Brenda Gibbs [id number] Date: 09/04/2018 Reason for consult: Initial assessment;Primapara;1st time breastfeeding;Term  Attempted to visit with mom at 10:18 pm but she was in the shower. Current note is from second attempt/visit.  12 hours old FT female who is being exclusively BF by her mother, she's a P1. Mom reported (+) breast changes during the pregnancy, and she's already familiar with hand expression. When [facility] revised hand expression with mom she was able to get colostrum very easily. RN had already notified LC that mom was getting sore, noticed tiny scabs and redness on both of her nipples, probably due to a shallow latch, mom told LC that baby has been grabbing just the nipple, not the entire nipple/areola complex. Mom has a DEBP at home.  Offered assistance with latch and mom agreed to have baby STS. LC took baby to mom's right breast and she was able to latch almost right away but eagerly and with a shallow latch, LC showed mom to wait until baby has a really wide open mouth before taking her to the breast doing a "sandwich hold". Baby was able to feed for 10 minutes with a few audible swallows noted. Mom said this feeding felt different and her nipple looked nice and rounded when baby was done, parents very pleased. Reviewed normal newborn behavior, cluster feeding and feeding cues.  Feeding plan:  1. Encouraged mom to feed baby STS 8-12 times/24 hours or sooner if feeding cues are present.  2. Hand expression and spoon feeding were also strongly encouraged 3. Mom will use her own colostrum to treat nipple soreness, her RN also brought coconut oil to her room  BF brochure, BF resources and feeding diary were reviewed. Parents reported all questions and concerns were answered, they're both aware of [place] services and will call PRN.  Maternal Data Formula Feeding for Exclusion: No Has patient been  taught Hand Expression?: Yes Does the patient have breastfeeding experience prior to this delivery?: No  Feeding Feeding Type: Breast Fed  LATCH Score Latch: Grasps breast easily, tongue down, lips flanged, rhythmical sucking.  Audible Swallowing: A few with stimulation(with breast compressions)  Type of Nipple: Everted at rest and after stimulation  Comfort (Breast/Nipple): Filling, red/small blisters or bruises, mild/mod discomfort(small scabs)  Hold (Positioning): Assistance needed to correctly position infant at breast and maintain latch.  LATCH Score: 7  Interventions Interventions: Breast feeding basics reviewed;Assisted with latch;Skin to skin;Breast massage;Hand express;Breast compression;Support pillows;Coconut oil  Lactation Tools Discussed/Used WIC Program: No   Consult Status Consult Status: Follow-up Date: 09/05/18 Follow-up type: In-patient    Brenda Gibbs 09/04/2018, 11:34 PM

## 2018-09-04 NOTE — Plan of Care (Signed)
  Problem: Education: Goal: Knowledge of General Education information will improve Description: Including pain rating scale, medication(s)/side effects and non-pharmacologic comfort measures Note: Discussed with and demonstrated to patient signs of proper latch during breast feeding. Assisted patient to correctly position baby and patient able to recognize nutritive and non nutritive suck pattern. Discussed the importance of skin to skin during feedings and unwrapping baby out of swaddle during feedings. Maxwell Caul, Leretha Dykes [place]

## 2018-09-05 DIAGNOSIS — O139 Gestational [pregnancy-induced] hypertension without significant proteinuria, unspecified trimester: Secondary | ICD-10-CM | POA: Clinically undetermined

## 2018-09-05 MED ORDER — IBUPROFEN 600 MG PO TABS: 600.0000 mg | ORAL_TABLET | Freq: Four times a day (QID) | ORAL | 0 refills | Status: DC | PRN

## 2018-09-05 NOTE — Progress Notes (Signed)
Patient states felt dizzy and had a headache, BP WNL, no other symptoms noted. Patient states that headache and dizziness resolving while sitting.

## 2018-09-05 NOTE — Lactation Note (Signed)
This note was copied from a baby's chart. Lactation Consultation Note  Patient Name: [name] Date: 09/05/2018 Reason for consult: Follow-up assessment   P1, Baby 7 hours old and mother states she could not wake her to breastfeed. Unwrapped baby and checked her diaper. Reviewed hand expression with mother with drops expressed. Assisted with latching baby in cross cradle hold encouraged breast compression to keep baby active. Mother has coconut oil for sore L nipple. Reminded mother to breastfeed on both breasts as much as possible. Feed on demand approximately 8-12 times per day.   Reviewed engorgement care and monitoring voids/stools.    Maternal Data Has patient been taught Hand Expression?: Yes Does the patient have breastfeeding experience prior to this delivery?: No  Feeding Feeding Type: Breast Fed  LATCH Score Latch: Grasps breast easily, tongue down, lips flanged, rhythmical sucking.  Audible Swallowing: A few with stimulation  Type of Nipple: Everted at rest and after stimulation  Comfort (Breast/Nipple): Filling, red/small blisters or bruises, mild/mod discomfort  Hold (Positioning): Assistance needed to correctly position infant at breast and maintain latch.  LATCH Score: 7  Interventions Interventions: Breast feeding basics reviewed;Assisted with latch;Hand express;Breast compression  Lactation Tools Discussed/Used     Consult Status Consult Status: Complete Date: 09/05/18    Vivianne Master [facility] 09/05/2018, 9:20 AM

## 2018-09-05 NOTE — Discharge Instructions (Signed)
Postpartum Care After Vaginal Delivery ° °The period of time right after you deliver your newborn is called the postpartum period. °What kind of medical care will I receive? °· You may continue to receive fluids and medicines through an IV tube inserted into one of your veins. °· If an incision was made near your vagina (episiotomy) or if you had some vaginal tearing during delivery, cold compresses may be placed on your episiotomy or your tear. This helps to reduce pain and swelling. °· You may be given a squirt bottle to use when you go to the bathroom. You may use this until you are comfortable wiping as usual. To use the squirt bottle, follow these steps: °? Before you urinate, fill the squirt bottle with warm water. Do not use hot water. °? After you urinate, while you are sitting on the toilet, use the squirt bottle to rinse the area around your urethra and vaginal opening. This rinses away any urine and blood. °? You may do this instead of wiping. As you start healing, you may use the squirt bottle before wiping yourself. Make sure to wipe gently. °? Fill the squirt bottle with clean water every time you use the bathroom. °· You will be given sanitary pads to wear. °How can I expect to feel? °· You may not feel the need to urinate for several hours after delivery. °· You will have some soreness and pain in your abdomen and vagina. °· If you are breastfeeding, you may have uterine contractions every time you breastfeed for up to several weeks postpartum. Uterine contractions help your uterus return to its normal size. °· It is normal to have vaginal bleeding (lochia) after delivery. The amount and appearance of lochia is often similar to a menstrual period in the first week after delivery. It will gradually decrease over the next few weeks to a dry, yellow-brown discharge. For most women, lochia stops completely by 6-8 weeks after delivery. Vaginal bleeding can vary from woman to woman. °· Within the first few  days after delivery, you may have breast engorgement. This is when your breasts feel heavy, full, and uncomfortable. Your breasts may also throb and feel hard, tightly stretched, warm, and tender. After this occurs, you may have milk leaking from your breasts. Your health care provider can help you relieve discomfort due to breast engorgement. Breast engorgement should go away within a few days. °· You may feel more sad or worried than normal due to hormonal changes after delivery. These feelings should not last more than a few days. If these feelings do not go away after several days, speak with your health care provider. °How should I care for myself? °· Tell your health care provider if you have pain or discomfort. °· Drink enough water to keep your urine clear or pale yellow. °· Wash your hands thoroughly with soap and water for at least 20 seconds after changing your sanitary pads, after using the toilet, and before holding or feeding your baby. °· If you are not breastfeeding, avoid touching your breasts a lot. Doing this can make your breasts produce more milk. °· If you become weak or lightheaded, or you feel like you might faint, ask for help before: °? Getting out of bed. °? Showering. °· Change your sanitary pads frequently. Watch for any changes in your flow, such as a sudden increase in volume, a change in color, the passing of large blood clots. If you pass a blood clot from your vagina,   save it to show to your health care provider. Do not flush blood clots down the toilet without having your health care provider look at them.  Make sure that all your vaccinations are up to date. This can help protect you and your baby from getting certain diseases. You may need to have immunizations done before you leave the hospital.  If desired, talk with your health care provider about methods of family planning or birth control (contraception). How can I start bonding with my baby? Spending as much time as  possible with your baby is very important. During this time, you and your baby can get to know each other and develop a bond. Having your baby stay with you in your room (rooming in) can give you time to get to know your baby. Rooming in can also help you become comfortable caring for your baby. Breastfeeding can also help you bond with your baby. How can I plan for returning home with my baby?  Make sure that you have a car seat installed in your vehicle. ? Your car seat should be checked by a certified car seat installer to make sure that it is installed safely. ? Make sure that your baby fits into the car seat safely.  Ask your health care provider any questions you have about caring for yourself or your baby. Make sure that you are able to contact your health care provider with any questions after leaving the hospital. This information is not intended to replace advice given to you by your health care provider. Make sure you discuss any questions you have with your health care provider. Document Released: 12/01/2006 Document Revised: 07/09/2015 Document Reviewed: 01/08/2015 Elsevier Interactive Patient Education  2018 [employer].   Postpartum Depression and Baby Blues The postpartum period begins right after the birth of a baby. During this time, there is often a great amount of joy and excitement. It is also a time of many changes in the life of the parents. Regardless of how many times a mother gives birth, each child brings new challenges and dynamics to the family. It is not unusual to have feelings of excitement along with confusing shifts in moods, emotions, and thoughts. All mothers are at risk of developing postpartum depression or the "baby blues." These mood changes can occur right after giving birth, or they may occur many months after giving birth. The baby blues or postpartum depression can be mild or severe. Additionally, postpartum depression can go away rather quickly, or it can  be a long-term condition. What are the causes? Raised hormone levels and the rapid drop in those levels are thought to be a main cause of postpartum depression and the baby blues. A number of hormones change during and after pregnancy. Estrogen and progesterone usually decrease right after the delivery of your baby. The levels of thyroid hormone and various cortisol steroids also rapidly drop. Other factors that play a role in these mood changes include major life events and genetics. What increases the risk? If you have any of the following risks for the baby blues or postpartum depression, know what symptoms to watch out for during the postpartum period. Risk factors that may increase the likelihood of getting the baby blues or postpartum depression include:  Having a personal or family history of depression.  Having depression while being pregnant.  Having premenstrual mood issues or mood issues related to oral contraceptives.  Having a lot of life stress.  Having marital conflict.  Lacking  a social support network. °· Having a baby with special needs. °· Having health problems, such as diabetes. ° °What are the signs or symptoms? °Symptoms of baby blues include: °· Brief changes in mood, such as going from extreme happiness to sadness. °· Decreased concentration. °· Difficulty sleeping. °· Crying spells, tearfulness. °· Irritability. °· Anxiety. ° °Symptoms of postpartum depression typically begin within the first month after giving birth. These symptoms include: °· Difficulty sleeping or excessive sleepiness. °· Marked weight loss. °· Agitation. °· Feelings of worthlessness. °· Lack of interest in activity or food. ° °Postpartum psychosis is a very serious condition and can be dangerous. Fortunately, it is rare. Displaying any of the following symptoms is cause for immediate medical attention. Symptoms of postpartum psychosis include: °· Hallucinations and delusions. °· Bizarre or disorganized  behavior. °· Confusion or disorientation. ° °How is this diagnosed? °A diagnosis is made by an evaluation of your symptoms. There are no medical or lab tests that lead to a diagnosis, but there are various questionnaires that a health care provider may use to identify those with the baby blues, postpartum depression, or psychosis. Often, a screening tool called the Edinburgh Postnatal Depression Scale is used to diagnose depression in the postpartum period. °How is this treated? °The baby blues usually goes away on its own in 1-2 weeks. Social support is often all that is needed. You will be encouraged to get adequate sleep and rest. Occasionally, you may be given medicines to help you sleep. °Postpartum depression requires treatment because it can last several months or longer if it is not treated. Treatment may include individual or group therapy, medicine, or both to address any social, physiological, and psychological factors that may play a role in the depression. Regular exercise, a healthy diet, rest, and social support may also be strongly recommended. °Postpartum psychosis is more serious and needs treatment right away. Hospitalization is often needed. °Follow these instructions at home: °· Get as much rest as you can. Nap when the baby sleeps. °· Exercise regularly. Some women find yoga and walking to be beneficial. °· Eat a balanced and nourishing diet. °· Do little things that you enjoy. Have a cup of tea, take a bubble bath, read your favorite magazine, or listen to your favorite music. °· Avoid alcohol. °· Ask for help with household chores, cooking, grocery shopping, or running errands as needed. Do not try to do everything. °· Talk to people close to you about how you are feeling. Get support from your partner, family members, friends, or other new moms. °· Try to stay positive in how you think. Think about the things you are grateful for. °· Do not spend a lot of time alone. °· Only take  over-the-counter or prescription medicine as directed by your health care provider. °· Keep all your postpartum appointments. °· Let your health care provider know if you have any concerns. °Contact a health care provider if: °You are having a reaction to or problems with your medicine. °Get help right away if: °· You have suicidal feelings. °· You think you may harm the baby or someone else. °This information is not intended to replace advice given to you by your health care provider. Make sure you discuss any questions you have with your health care provider. °Document Released: 11/08/2003 Document Revised: 07/12/2015 Document Reviewed: 11/15/2012 °Elsevier Interactive Patient Education © 2017 [employer] ° ° °Preeclampsia and Eclampsia °Preeclampsia is a serious condition that may develop during pregnancy. This condition   causes high blood pressure and increased protein in your urine along with other symptoms, such as headaches and vision changes. These symptoms may develop as the condition gets worse. Preeclampsia may occur at 20 weeks of pregnancy or later. Diagnosing and treating preeclampsia early is very important. If not treated early, it can cause serious problems for you and your baby. One problem it can lead to is eclampsia. Eclampsia is a condition that causes muscle jerking or shaking (convulsions or seizures) and other serious problems for the mother. During pregnancy, delivering your baby may be the best treatment for preeclampsia or eclampsia. For most women, preeclampsia and eclampsia symptoms go away after giving birth. In rare cases, a woman may develop preeclampsia after giving birth (postpartum preeclampsia). This usually occurs within 48 hours after childbirth but may occur up to 6 weeks after giving birth. What are the causes? The cause of preeclampsia is not known. What increases the risk? The following risk factors make you more likely to develop preeclampsia:  Being pregnant for  the first time.  Having had preeclampsia during a past pregnancy.  Having a family history of preeclampsia.  Having high blood pressure.  Being pregnant with more than one baby.  Being 6335 or older.  Being African-American.  Having kidney disease or diabetes.  Having medical conditions such as lupus or blood diseases.  Being very overweight (obese). What are the signs or symptoms? The most common symptoms are:  Severe headaches.  Vision problems, such as blurred or double vision.  Abdominal pain, especially upper abdominal pain. Other symptoms that may develop as the condition gets worse include:  Sudden weight gain.  Sudden swelling of the hands, face, legs, and feet.  Severe nausea and vomiting.  Numbness in the face, arms, legs, and feet.  Dizziness.  Urinating less than usual.  Slurred speech.  Convulsions or seizures. How is this diagnosed? There are no screening tests for preeclampsia. Your health care provider will ask you about symptoms and check for signs of preeclampsia during your prenatal visits. You may also have tests that include:  Checking your blood pressure.  Urine tests to check for protein. Your health care provider will check for this at every prenatal visit.  Blood tests.  Monitoring your baby's heart rate.  Ultrasound. How is this treated? You and your health care provider will determine the treatment approach that is best for you. Treatment may include:  Having more frequent prenatal exams to check for signs of preeclampsia, if you have an increased risk for preeclampsia.  Medicine to lower your blood pressure.  Staying in the hospital, if your condition is severe. There, treatment will focus on controlling your blood pressure and the amount of fluids in your body (fluid retention).  Taking medicine (magnesium sulfate) to prevent seizures. This may be given as an injection or through an IV.  Taking a low-dose aspirin during  your pregnancy.  Delivering your baby early. You may have your labor started with medicine (induced), or you may have a cesarean delivery. Follow these instructions at home: Eating and drinking   Drink enough fluid to keep your urine pale yellow.  Avoid caffeine. Lifestyle  Do not use any products that contain nicotine or tobacco, such as cigarettes and e-cigarettes. If you need help quitting, ask your health care provider.  Do not use alcohol or drugs.  Avoid stress as much as possible. Rest and get plenty of sleep. General instructions  Take over-the-counter and prescription medicines only as told  by your health care provider.  When lying down, lie on your left side. This keeps pressure off your major blood vessels.  When sitting or lying down, raise (elevate) your feet. Try putting some pillows underneath your lower legs.  Exercise regularly. Ask your health care provider what kinds of exercise are best for you.  Keep all follow-up and prenatal visits as told by your health care provider. This is important. How is this prevented? There is no known way of preventing preeclampsia or eclampsia from developing. However, to lower your risk of complications and detect problems early:  Get regular prenatal care. Your health care provider may be able to diagnose and treat the condition early.  Maintain a healthy weight. Ask your health care provider for help managing weight gain during pregnancy.  Work with your health care provider to manage any long-term (chronic) health conditions you have, such as diabetes or kidney problems.  You may have tests of your blood pressure and kidney function after giving birth.  Your health care provider may have you take low-dose aspirin during your next pregnancy. Contact a health care provider if:  You have symptoms that your health care provider told you may require more treatment or monitoring, such as: ? Headaches. ? Nausea or  vomiting. ? Abdominal pain. ? Dizziness. ? Light-headedness. Get help right away if:  You have severe: ? Abdominal pain. ? Headaches that do not get better. ? Dizziness. ? Vision problems. ? Confusion. ? Nausea or vomiting.  You have any of the following: ? A seizure. ? Sudden, rapid weight gain. ? Sudden swelling in your hands, ankles, or face. ? Trouble moving any part of your body. ? Numbness in any part of your body. ? Trouble speaking. ? Abnormal bleeding.  You faint. Summary  Preeclampsia is a serious condition that may develop during pregnancy.  This condition causes high blood pressure and increased protein in your urine along with other symptoms, such as headaches and vision changes.  Diagnosing and treating preeclampsia early is very important. If not treated early, it can cause serious problems for you and your baby.  Get help right away if you have symptoms that your health care provider told you to watch for. This information is not intended to replace advice given to you by your health care provider. Make sure you discuss any questions you have with your health care provider. Document Released: 02/01/2000 Document Revised: 10/06/2017 Document Reviewed: 09/10/2015 Elsevier Patient Education  2020 [employer].

## 2018-09-05 NOTE — Discharge Summary (Signed)
OB Discharge Summary     Patient Name: Brenda Gibbs DOB: 05/24/1985 MRN: [id number]  Date of admission: 09/03/2018 Delivering MD: Janeece RiggersGREER, ELLIS K   Date of discharge: 09/05/2018  Admitting diagnosis: 41WKS CTX Intrauterine pregnancy: 2881w0d     Secondary diagnosis:  Active Problems:   Post term pregnancy, [redacted] weeks   Gestational hypertension      Discharge diagnosis: Term Pregnancy Delivered                                                                                                Post partum procedures:n/a  Augmentation: AROM and Pitocin  Complications: None  Hospital course:  Induction of Labor With Vaginal Delivery   33 y.o. yo G1P1001 at 8581w0d was admitted to the hospital 09/03/2018 for induction of labor.  Indication for induction: Postdates.  Patient had an uncomplicated labor course as follows: Membrane Rupture Time/Date: 6:48 PM ,09/03/2018   Intrapartum Procedures: Episiotomy: None [1]                                         Lacerations:  1st degree [2];Perineal [11]  Patient had delivery of a Viable infant.  Information for the patient's newborn:  Lance MussVelezheva, Girl Aubry [[id number]][[id number]]  Delivery Method: Vag-Spont    09/03/2018  Details of delivery can be found in separate delivery note.  Patient had a routine postpartum course. Patient is discharged home 09/05/18.  Physical exam  Vitals:   09/04/18 0620 09/04/18 1100 09/04/18 1500 09/04/18 2159  BP: (!) 108/52 109/64 112/72 116/76  Pulse: 87 100  (!) 102  Resp: 16 16 18 17   Temp: 98.8 F (37.1 C) 98.9 F (37.2 C) 98.5 F (36.9 C) 98.7 F (37.1 C)  TempSrc: Oral Oral Oral Oral  SpO2: 99% 97%  99%  Weight:      Height:       General: alert, cooperative and no distress Lochia: appropriate Uterine Fundus: firm Incision: N/A DVT Evaluation: No evidence of DVT seen on physical exam. Negative Homan's sign. No cords or calf tenderness. No significant calf/ankle edema. GHTN: Patient denies symptoms of  preeclampsia. Reviewed patient's vitals with Dr. Su Hiltoberts, per Dr. Su Hiltoberts patient is stable for discharge home with preeclampsia precautions. Plan to follow up in 1 week for blood pressure check. Preeclampsia precautions discussed and handout provided. Patient verbalizes she will call or present to [facility] for symptoms of preeclampsia.   Labs: Lab Results  Component Value Date   WBC 19.8 (H) 09/04/2018   HGB 11.1 (L) 09/04/2018   HCT 33.1 (L) 09/04/2018   MCV 88.3 09/04/2018   PLT 193 09/04/2018   CMP Latest Ref Rng & Units 09/03/2018  Glucose 70 - 99 mg/dL 97  BUN 6 - 20 mg/dL 7  Creatinine 3.470.44 - 4.251.00 mg/dL 9.560.69  Sodium 387135 - 564145 mmol/L 136  Potassium 3.5 - 5.1 mmol/L 3.9  Chloride 98 - 111 mmol/L 105  CO2 22 - 32 mmol/L 19(L)  Calcium 8.9 - 10.3 mg/dL 9.1  Total Protein 6.5 - 8.1 g/dL 6.5  Total Bilirubin 0.3 - 1.2 mg/dL 0.4  Alkaline Phos 38 - 126 U/L 130(H)  AST 15 - 41 U/L 18  ALT 0 - 44 U/L 12    Discharge instruction: per After Visit Summary and "Baby and Me Booklet". Preeclampsia precautions discussed and handout provided.   After visit meds:  Allergies as of 09/05/2018      Reactions   Eggs Or Egg-derived Products Nausea And Vomiting      Medication List    TAKE these medications   famotidine 20 MG tablet Commonly known as: Pepcid Take 1 tablet (20 mg total) by mouth daily for 30 days. What changed: additional instructions   ibuprofen 600 MG tablet Commonly known as: ADVIL Take 1 tablet (600 mg total) by mouth every 6 (six) hours as needed for moderate pain.   prenatal multivitamin Tabs tablet Take 1 tablet by mouth daily at 12 noon.       Diet: routine diet  Activity: Advance as tolerated. Pelvic rest for 6 weeks.   Outpatient follow up:1 week for BP check, 6 weeks for PP visit Follow up Appt:No future appointments. Follow up Visit:No follow-ups on file.  Postpartum contraception: Progesterone only pills  Newborn Data: Live born female  Birth  Weight: 7 lb 11.5 oz (3500 g) APGAR: 9, 9  Newborn Delivery   Birth date/time: 09/03/2018 23:03:00 Delivery type: Vaginal, Spontaneous      Baby Feeding: Breast Disposition:home with mother   09/05/2018 Marikay Alar, CNM

## 2020-01-25 IMAGING — US US ABDOMEN LIMITED
1 series · 14 of 25 positions shown · non-contrast
Comparison: None.

CLINICAL DATA: Right upper quadrant pain

EXAM:
ULTRASOUND ABDOMEN LIMITED RIGHT UPPER QUADRANT

[Series 1: us abdomen limited · 0.19mm/px · 14 of 56 slices shown]
[im 1/56]
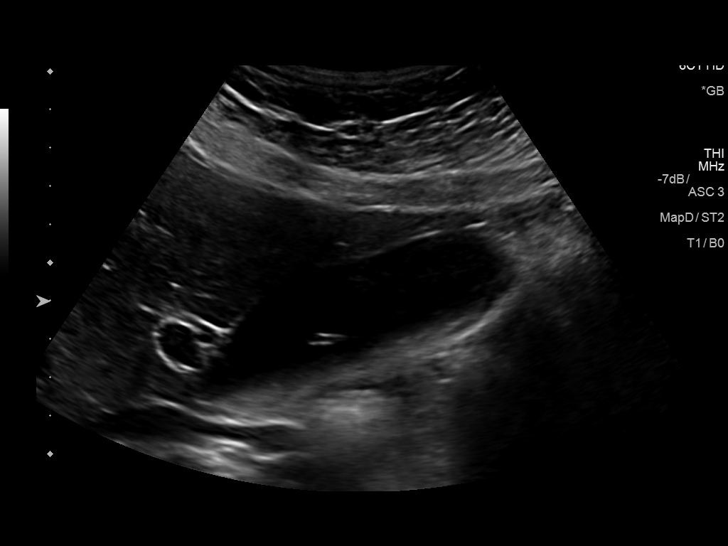
[im 5/56]
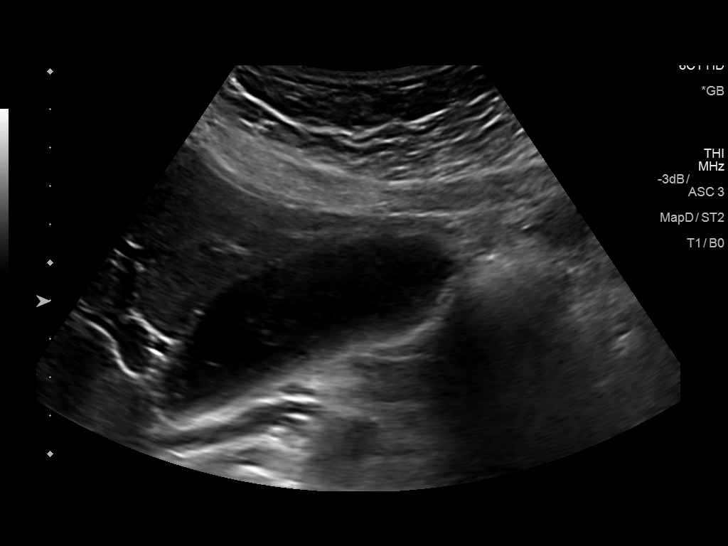
[im 10/56]
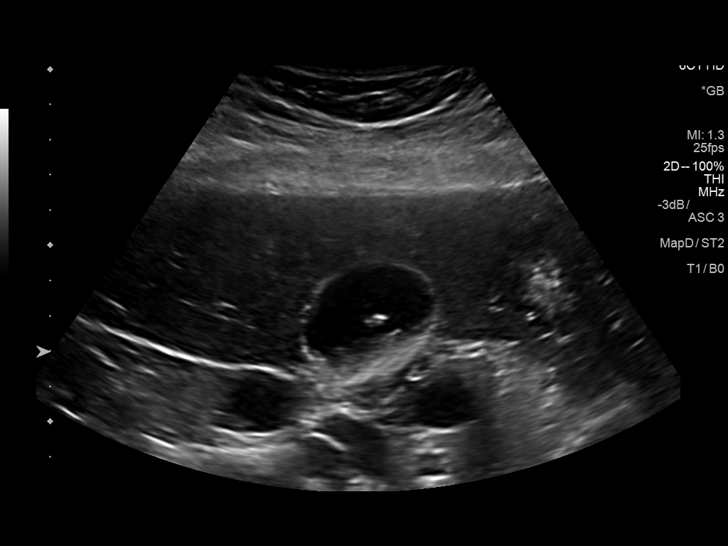
[im 14/56]
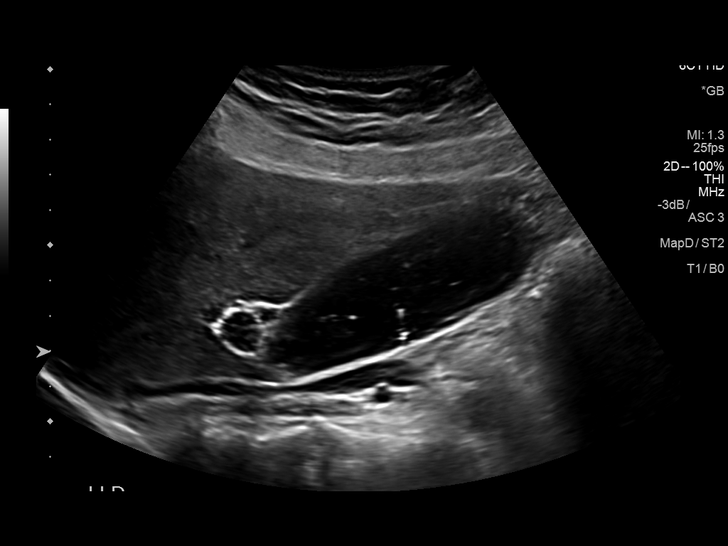
[im 19/56]
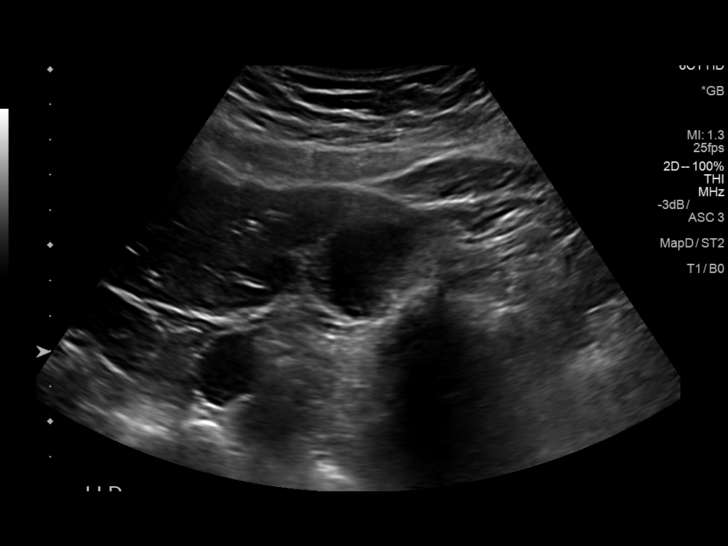
[im 21/56]
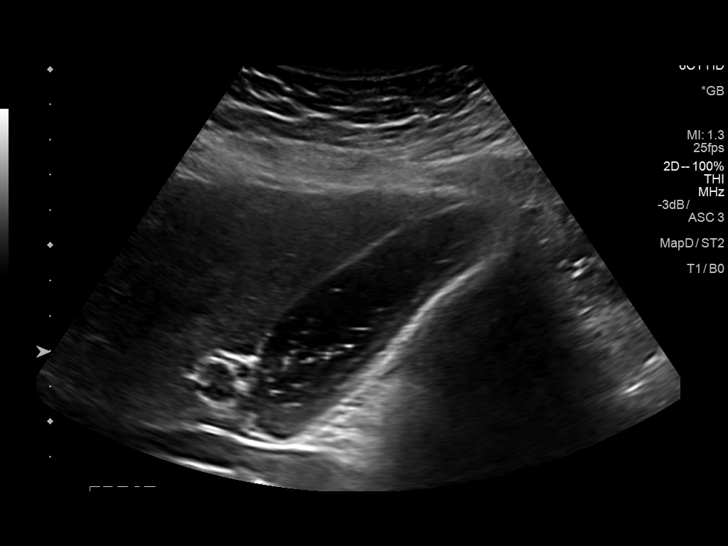
[im 26/56]
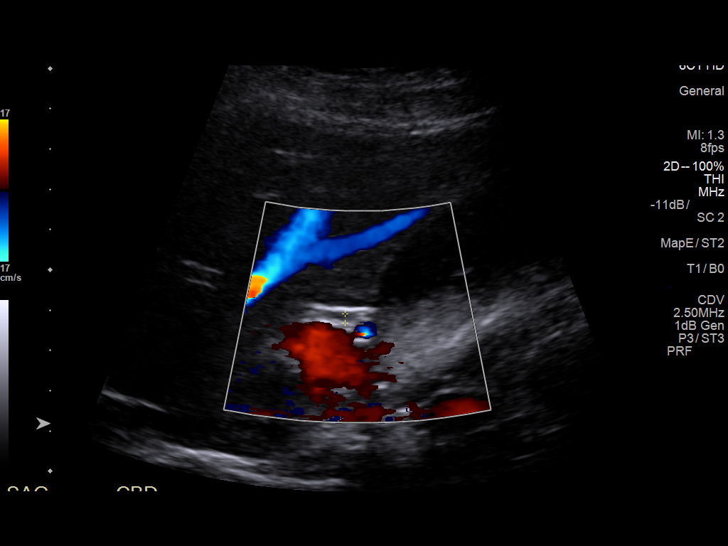
[im 30/56]
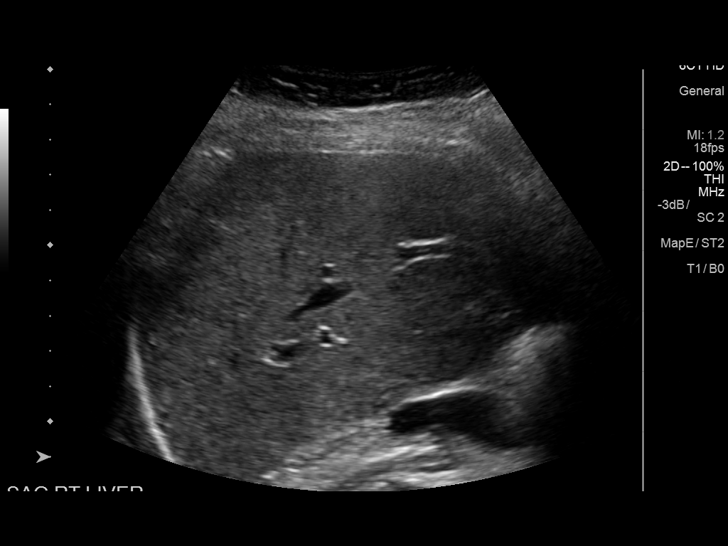
[im 35/56]
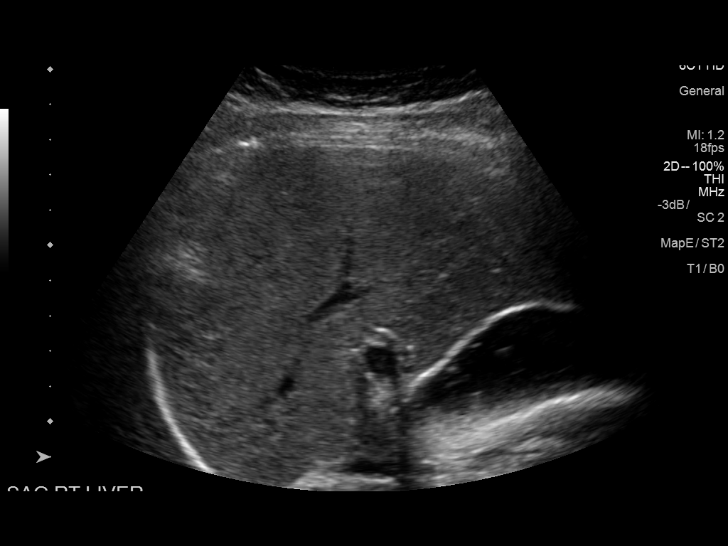
[im 37/56]
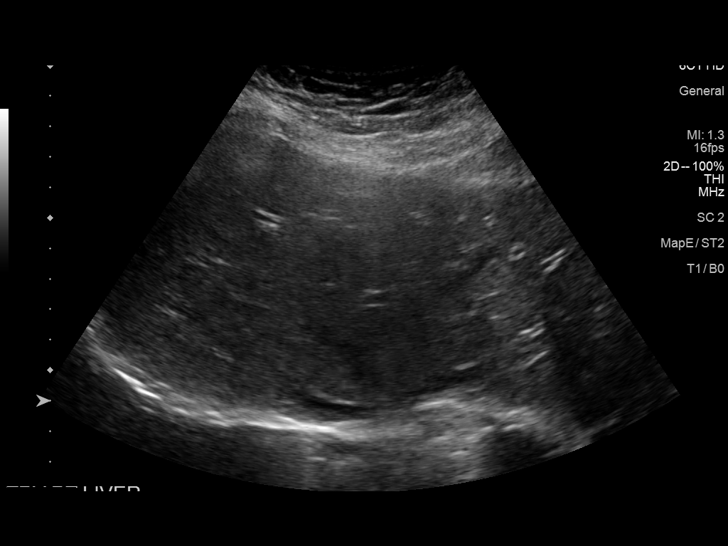
[im 42/56]
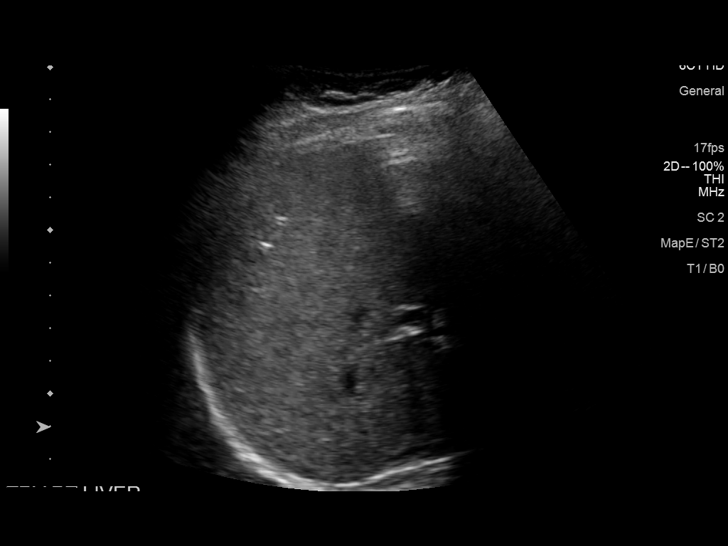
[im 46/56]
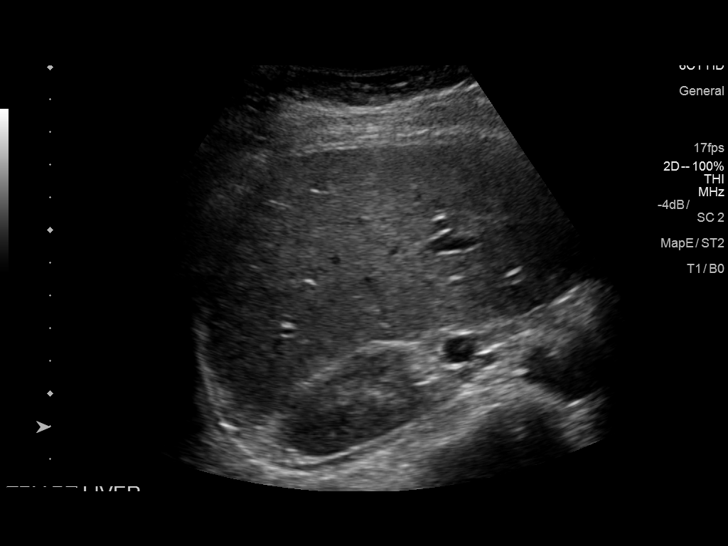
[im 51/56]
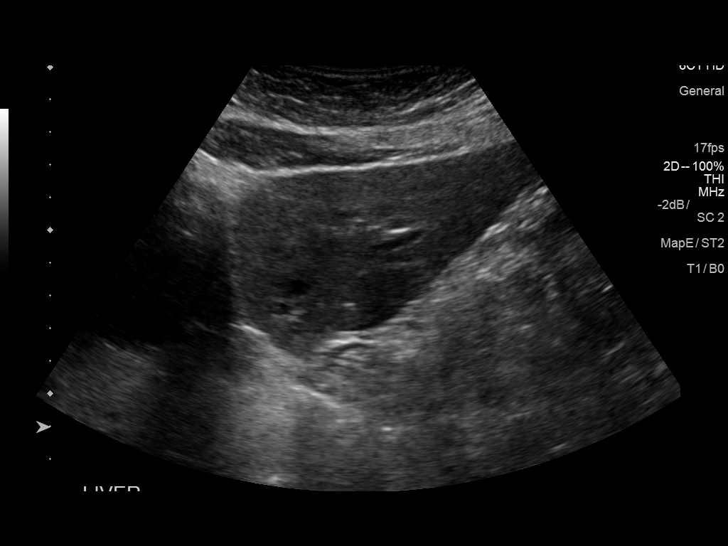
[im 56/56]
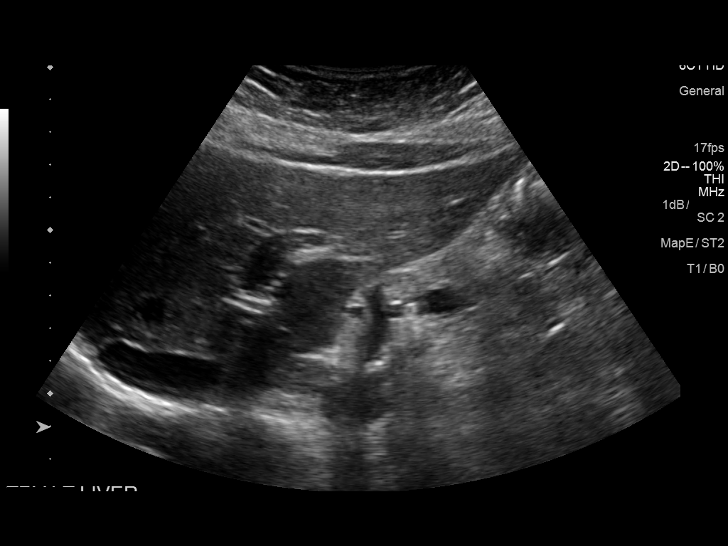

[14 of 25 positions shown; findings below may reference images not displayed]

FINDINGS: Gallbladder:

Gallstones and gallbladder sludge. No gallbladder wall thickening.
No pericholecystic fluid. No sonographic Murphy sign noted by
sonographer.

Common bile duct:

Diameter: 0.2 cm

Liver:

No focal lesion identified. Within normal limits in parenchymal
echogenicity. Portal vein is patent on color Doppler imaging with
normal direction of blood flow towards the liver.
IMPRESSION: Gallstones and sludge in the gallbladder. No gallbladder wall
thickening. No pericholecystic fluid. No biliary ductal dilatation.
Negative sonographic Murphy's sign.

## 2022-10-31 ENCOUNTER — Emergency Department (HOSPITAL_BASED_OUTPATIENT_CLINIC_OR_DEPARTMENT_OTHER)
Admission: EM | Admit: 2022-10-31 | Discharge: 2022-10-31 | Disposition: A | Discharge status: Home / Self Care | Payer: BC Managed Care – PPO | Attending: Emergency Medicine | Admitting: Emergency Medicine

## 2022-10-31 ENCOUNTER — Other Ambulatory Visit (HOSPITAL_BASED_OUTPATIENT_CLINIC_OR_DEPARTMENT_OTHER): Payer: Self-pay

## 2022-10-31 ENCOUNTER — Emergency Department (HOSPITAL_BASED_OUTPATIENT_CLINIC_OR_DEPARTMENT_OTHER): Payer: BC Managed Care – PPO

## 2022-10-31 ENCOUNTER — Other Ambulatory Visit: Payer: Self-pay

## 2022-10-31 DIAGNOSIS — M542 Cervicalgia: Secondary | ICD-10-CM | POA: Diagnosis present

## 2022-10-31 DIAGNOSIS — M7918 Myalgia, other site: Secondary | ICD-10-CM | POA: Diagnosis not present

## 2022-10-31 DIAGNOSIS — Y92410 Unspecified street and highway as the place of occurrence of the external cause: Secondary | ICD-10-CM | POA: Diagnosis not present

## 2022-10-31 MED ORDER — KETOROLAC TROMETHAMINE 15 MG/ML IJ SOLN
30.0000 mg | Freq: Once | INTRAMUSCULAR | Status: AC
  Administered 2022-10-31: 30 mg via INTRAMUSCULAR
  Filled 2022-10-31: qty 2

## 2022-10-31 MED ORDER — METHOCARBAMOL 500 MG PO TABS
1000.0000 mg | ORAL_TABLET | Freq: Three times a day (TID) | ORAL | 0 refills | Status: DC | PRN
  Filled 2022-10-31: qty 30, 5d supply, fill #0

## 2022-10-31 MED ORDER — ONDANSETRON 4 MG PO TBDP
4.0000 mg | ORAL_TABLET | Freq: Once | ORAL | Status: AC
  Administered 2022-10-31: 4 mg via ORAL
  Filled 2022-10-31: qty 1
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
## 2022-12-29 ENCOUNTER — Ambulatory Visit: Payer: BC Managed Care – PPO | Admitting: Sports Medicine

## 2022-12-29 VITALS — BP 132/84 | HR 87 | Ht 65.0 in | Wt 236.0 lb

## 2022-12-29 DIAGNOSIS — S060X0A Concussion without loss of consciousness, initial encounter: Secondary | ICD-10-CM | POA: Diagnosis not present

## 2022-12-29 DIAGNOSIS — M542 Cervicalgia: Secondary | ICD-10-CM | POA: Diagnosis not present

## 2022-12-29 DIAGNOSIS — R270 Ataxia, unspecified: Secondary | ICD-10-CM

## 2022-12-29 DIAGNOSIS — G44319 Acute post-traumatic headache, not intractable: Secondary | ICD-10-CM

## 2022-12-29 MED ORDER — MELOXICAM 15 MG PO TABS: 15.0000 mg | ORAL_TABLET | Freq: Every day | ORAL | 0 refills | Status: DC

## 2022-12-29 NOTE — Patient Instructions (Addendum)
Recommendations:  - Relative mental and physical rest for 48 hours after concussive event -Recommend light aerobic activity while keeping symptoms less than 3/10 -Stop mental or physical activities that cause symptoms to worsen greater than 3/10, and wait 24 hours before attempting them again -Eliminate screen time as much as possible for first 48 hours after concussive event, then continue limited screen time (recommend less than 2 hours per day) - Start meloxicam 15 mg daily x2 weeks.  If still having pain after 2 weeks, complete 3rd-week of meloxicam. May use remaining meloxicam as needed once daily for pain control.  Do not to use additional NSAIDs while taking meloxicam.  May use Tylenol [phone] mg 2 to 3 times a day for breakthrough pain. PT referral  Work note provided can return 12/18 4 hours a day maximum  2 week follow up
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
## 2023-01-12 ENCOUNTER — Encounter: Payer: BC Managed Care – PPO | Admitting: Sports Medicine
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
## 2023-01-20 ENCOUNTER — Ambulatory Visit: Payer: BC Managed Care – PPO | Admitting: Sports Medicine

## 2023-01-20 VITALS — HR 108 | Ht 65.0 in | Wt 236.0 lb

## 2023-01-20 DIAGNOSIS — R270 Ataxia, unspecified: Secondary | ICD-10-CM

## 2023-01-20 DIAGNOSIS — M542 Cervicalgia: Secondary | ICD-10-CM

## 2023-01-20 DIAGNOSIS — S060X0A Concussion without loss of consciousness, initial encounter: Secondary | ICD-10-CM | POA: Diagnosis not present

## 2023-01-20 DIAGNOSIS — G44319 Acute post-traumatic headache, not intractable: Secondary | ICD-10-CM

## 2023-01-20 NOTE — Patient Instructions (Addendum)
Brain MRI w contrast  Out of work for 4 weeks until re-evaluated Follow up 5 days after brain MRI  Start PT  Meloxicam for neck pain as needed

## 2023-01-21 ENCOUNTER — Encounter: Payer: Self-pay | Admitting: Sports Medicine

## 2023-01-22 ENCOUNTER — Other Ambulatory Visit: Payer: Self-pay | Admitting: Sports Medicine

## 2023-01-22 DIAGNOSIS — F40240 Claustrophobia: Secondary | ICD-10-CM

## 2023-01-22 MED ORDER — LORAZEPAM 0.5 MG PO TABS
ORAL_TABLET | ORAL | 0 refills | Status: AC
Start: 2023-01-22 — End: ?

## 2023-01-22 NOTE — Progress Notes (Signed)
Ativan 0.5 mg prescribed for claustrophobia in MRI machine.  May take 1 to 2 tablets as needed prior to imaging study.

## 2023-01-27 ENCOUNTER — Other Ambulatory Visit: Payer: BC Managed Care – PPO
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
## 2023-01-29 ENCOUNTER — Ambulatory Visit: Payer: BC Managed Care – PPO | Admitting: Sports Medicine

## 2023-02-02 ENCOUNTER — Ambulatory Visit: Payer: BC Managed Care – PPO

## 2023-02-06 ENCOUNTER — Ambulatory Visit: Payer: BC Managed Care – PPO | Admitting: Sports Medicine

## 2023-02-10 ENCOUNTER — Ambulatory Visit: Payer: BC Managed Care – PPO

## 2023-02-10 DIAGNOSIS — G44319 Acute post-traumatic headache, not intractable: Secondary | ICD-10-CM | POA: Diagnosis not present

## 2023-02-10 DIAGNOSIS — S060X0A Concussion without loss of consciousness, initial encounter: Secondary | ICD-10-CM | POA: Diagnosis not present

## 2023-02-10 MED ORDER — GADOBUTROL 1 MMOL/ML IV SOLN
10.0000 mL | Freq: Once | INTRAVENOUS | Status: AC | PRN
  Administered 2023-02-10: 10 mL via INTRAVENOUS
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
## 2023-02-23 ENCOUNTER — Ambulatory Visit: Payer: BC Managed Care – PPO | Admitting: Sports Medicine
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
## 2023-02-25 ENCOUNTER — Ambulatory Visit: Payer: 59 | Admitting: Sports Medicine

## 2023-02-25 VITALS — BP 120/80 | HR 102 | Ht 65.0 in | Wt 236.0 lb

## 2023-02-25 DIAGNOSIS — S060X0A Concussion without loss of consciousness, initial encounter: Secondary | ICD-10-CM

## 2023-02-25 DIAGNOSIS — G44319 Acute post-traumatic headache, not intractable: Secondary | ICD-10-CM | POA: Diagnosis not present

## 2023-02-25 MED ORDER — MELOXICAM 15 MG PO TABS: 15.0000 mg | ORAL_TABLET | Freq: Every day | ORAL | 0 refills | Status: AC | PRN

## 2023-02-25 NOTE — Patient Instructions (Addendum)
 Work note provided take rest breaks every hour as needed  4 hours a day maximum . Starting 03/02/2023 can increase to 6 hours a day maximum.03/09/2023 can work 8 hours a day maximum  3 week follow up  Refill meloxicam  Heating pads over neck
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
## 2023-03-25 ENCOUNTER — Ambulatory Visit: Payer: 59 | Admitting: Sports Medicine

## 2023-03-25 VITALS — BP 108/70 | HR 105 | Ht 65.0 in | Wt 242.0 lb

## 2023-03-25 DIAGNOSIS — G44319 Acute post-traumatic headache, not intractable: Secondary | ICD-10-CM

## 2023-03-25 DIAGNOSIS — S060X0A Concussion without loss of consciousness, initial encounter: Secondary | ICD-10-CM
